# Patient Record
Sex: Female | Born: 1965 | Race: White | Hispanic: No | Marital: Married | Smoking: Never smoker
Health system: Southern US, Community
[De-identification: ages and names within clinical notes are randomized; demographics above are authoritative.]

## PROBLEM LIST (undated history)

## (undated) ENCOUNTER — Emergency Department (HOSPITAL_COMMUNITY): Payer: BC Managed Care – PPO

## (undated) DIAGNOSIS — K219 Gastro-esophageal reflux disease without esophagitis: Secondary | ICD-10-CM

## (undated) DIAGNOSIS — G43909 Migraine, unspecified, not intractable, without status migrainosus: Secondary | ICD-10-CM

## (undated) DIAGNOSIS — G4730 Sleep apnea, unspecified: Secondary | ICD-10-CM

## (undated) DIAGNOSIS — D649 Anemia, unspecified: Secondary | ICD-10-CM

## (undated) HISTORY — DX: Gastro-esophageal reflux disease without esophagitis: K21.9

## (undated) HISTORY — DX: Anemia, unspecified: D64.9

## (undated) HISTORY — PX: BARIATRIC SURGERY: SHX1103

## (undated) HISTORY — PX: OTHER SURGICAL HISTORY: SHX169

## (undated) HISTORY — PX: CHOLECYSTECTOMY: SHX55

---

## 1984-09-21 HISTORY — PX: REDUCTION MAMMAPLASTY: SUR839

## 2005-05-09 ENCOUNTER — Emergency Department: Payer: Self-pay | Admitting: Emergency Medicine

## 2005-11-03 ENCOUNTER — Ambulatory Visit: Payer: Self-pay

## 2005-11-10 ENCOUNTER — Ambulatory Visit: Payer: Self-pay

## 2006-12-01 ENCOUNTER — Ambulatory Visit: Payer: Self-pay

## 2007-04-08 ENCOUNTER — Ambulatory Visit: Payer: Self-pay | Admitting: Unknown Physician Specialty

## 2009-07-21 ENCOUNTER — Emergency Department: Payer: Self-pay | Admitting: Unknown Physician Specialty

## 2010-10-02 ENCOUNTER — Ambulatory Visit: Payer: Self-pay | Admitting: Obstetrics and Gynecology

## 2010-12-05 ENCOUNTER — Ambulatory Visit: Payer: Self-pay | Admitting: Neurology

## 2011-12-21 ENCOUNTER — Ambulatory Visit: Payer: Self-pay

## 2012-12-28 ENCOUNTER — Ambulatory Visit: Payer: Self-pay

## 2013-03-10 ENCOUNTER — Ambulatory Visit: Payer: Self-pay | Admitting: Specialist

## 2013-03-10 LAB — COMPREHENSIVE METABOLIC PANEL
Albumin: 3.5 g/dL (ref 3.4–5.0)
Anion Gap: 3 — ABNORMAL LOW (ref 7–16)
BUN: 10 mg/dL (ref 7–18)
Bilirubin,Total: 0.3 mg/dL (ref 0.2–1.0)
Co2: 30 mmol/L (ref 21–32)
Creatinine: 0.68 mg/dL (ref 0.60–1.30)
EGFR (African American): >60
EGFR (Non-African Amer.): >60
Osmolality: 278 (ref 275–301)
SGOT(AST): 13 U/L — ABNORMAL LOW (ref 15–37)
SGPT (ALT): 20 U/L (ref 12–78)
Sodium: 139 mmol/L (ref 136–145)

## 2013-03-10 LAB — PROTIME-INR
INR: 0.9
Prothrombin Time: 12.8 secs (ref 11.5–14.7)

## 2013-03-10 LAB — CBC WITH DIFFERENTIAL/PLATELET
Basophil #: 0.1 10*3/uL (ref 0.0–0.1)
Basophil %: 0.8 %
Eosinophil #: 0.1 10*3/uL (ref 0.0–0.7)
HGB: 11.9 g/dL — ABNORMAL LOW (ref 12.0–16.0)
MCH: 26.6 pg (ref 26.0–34.0)
MCV: 80 fL (ref 80–100)
Monocyte #: 0.5 x10 3/mm (ref 0.2–0.9)
Neutrophil #: 4.9 10*3/uL (ref 1.4–6.5)
RBC: 4.45 10*6/uL (ref 3.80–5.20)
RDW: 14.5 % (ref 11.5–14.5)

## 2013-03-10 LAB — LIPASE, BLOOD: Lipase: 160 U/L (ref 73–393)

## 2013-03-10 LAB — AMYLASE: Amylase: 25 U/L (ref 25–115)

## 2013-03-10 LAB — FERRITIN: Ferritin (ARMC): 16 ng/mL (ref 8–388)

## 2013-03-10 LAB — IRON AND TIBC
Iron Saturation: 11 %
Iron: 31 ug/dL — ABNORMAL LOW (ref 50–170)

## 2013-03-10 LAB — TSH: Thyroid Stimulating Horm: 0.493 u[IU]/mL

## 2013-03-10 LAB — FOLATE: Folic Acid: 17 ng/mL (ref 3.1–100.0)

## 2013-03-10 LAB — PHOSPHORUS: Phosphorus: 3.2 mg/dL (ref 2.5–4.9)

## 2013-03-10 LAB — MAGNESIUM: Magnesium: 1.7 mg/dL — ABNORMAL LOW

## 2013-04-04 ENCOUNTER — Ambulatory Visit: Payer: Self-pay | Admitting: Specialist

## 2013-04-12 ENCOUNTER — Ambulatory Visit: Payer: Self-pay | Admitting: Specialist

## 2013-04-21 ENCOUNTER — Ambulatory Visit: Payer: Self-pay | Admitting: Specialist

## 2013-05-17 ENCOUNTER — Ambulatory Visit: Payer: Self-pay | Admitting: Specialist

## 2013-09-18 ENCOUNTER — Other Ambulatory Visit: Payer: Self-pay | Admitting: Specialist

## 2013-09-19 LAB — CLOSTRIDIUM DIFFICILE(ARMC)

## 2013-11-01 ENCOUNTER — Ambulatory Visit: Payer: Self-pay | Admitting: Specialist

## 2013-11-09 ENCOUNTER — Ambulatory Visit: Payer: Self-pay | Admitting: Specialist

## 2013-11-09 LAB — HCG, QUANTITATIVE, PREGNANCY

## 2013-11-14 DIAGNOSIS — K8020 Calculus of gallbladder without cholecystitis without obstruction: Secondary | ICD-10-CM | POA: Insufficient documentation

## 2014-01-08 DIAGNOSIS — G43909 Migraine, unspecified, not intractable, without status migrainosus: Secondary | ICD-10-CM | POA: Insufficient documentation

## 2014-01-08 DIAGNOSIS — K219 Gastro-esophageal reflux disease without esophagitis: Secondary | ICD-10-CM

## 2014-01-08 HISTORY — DX: Gastro-esophageal reflux disease without esophagitis: K21.9

## 2014-03-02 IMAGING — US ABDOMEN ULTRASOUND LIMITED
1 series · 14 of 25 positions shown · non-contrast
Comparison: US ABDOMEN LIMITED RUQ/ASCITES dated 03/10/2013

CLINICAL DATA: Gastric bypass surgery

EXAM:
US ABDOMEN LIMITED - RIGHT UPPER QUADRANT

[Series 1: abdomen ultrasound limited · 0.27mm/px · 14 of 44 slices shown]
[im 1/44]
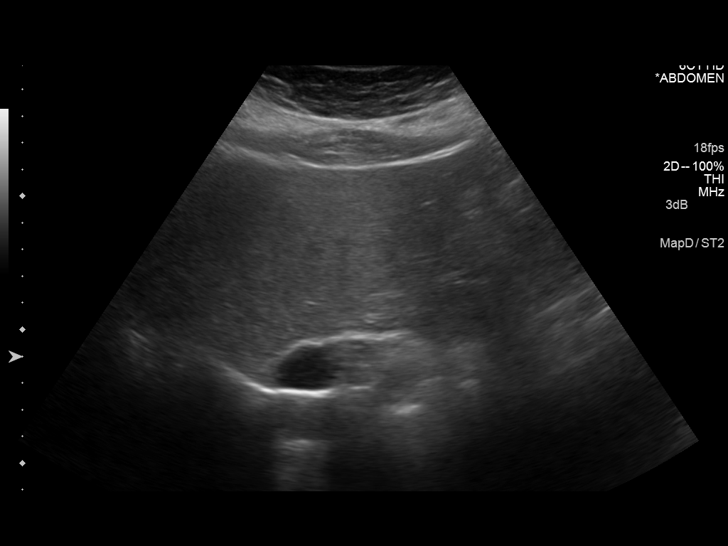
[im 4/44]
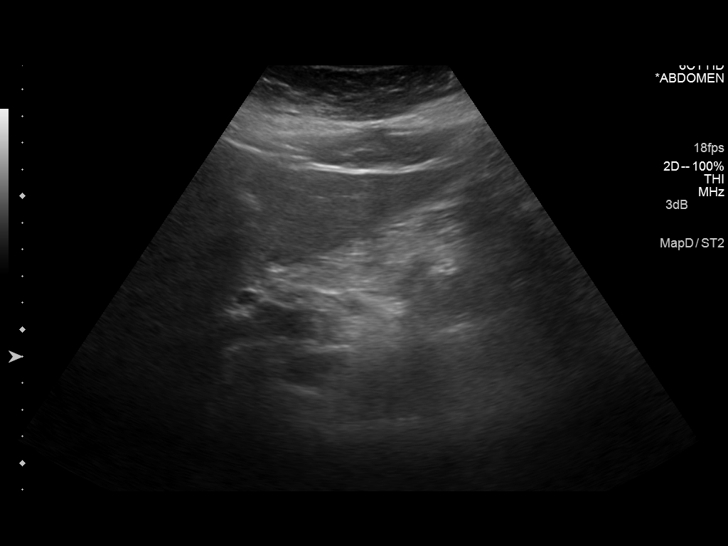
[im 8/44]
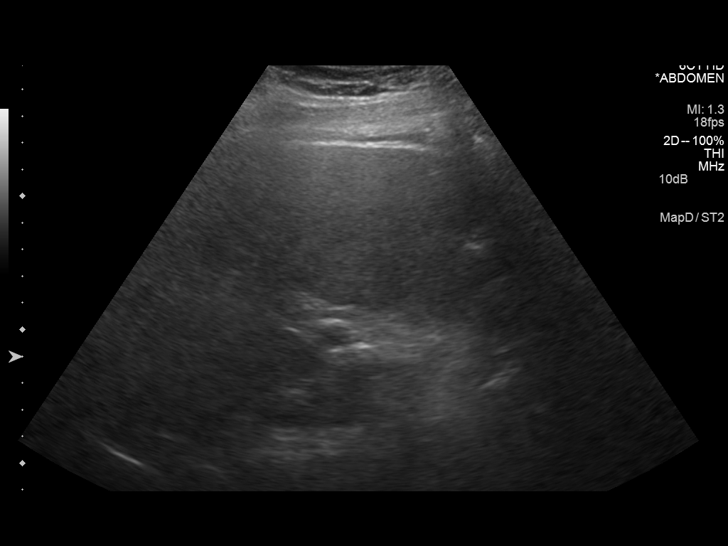
[im 11/44]
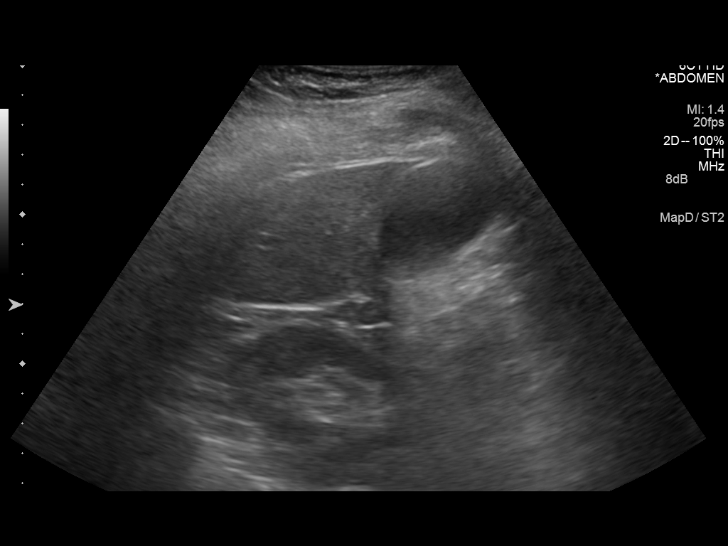
[im 15/44]
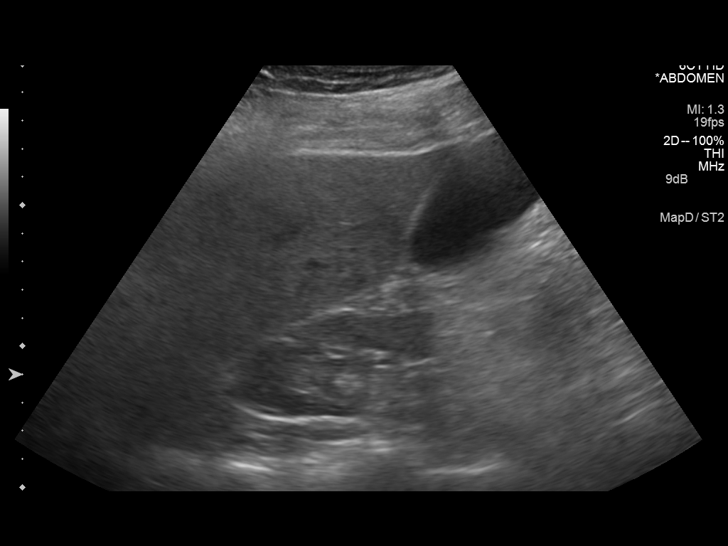
[im 17/44]
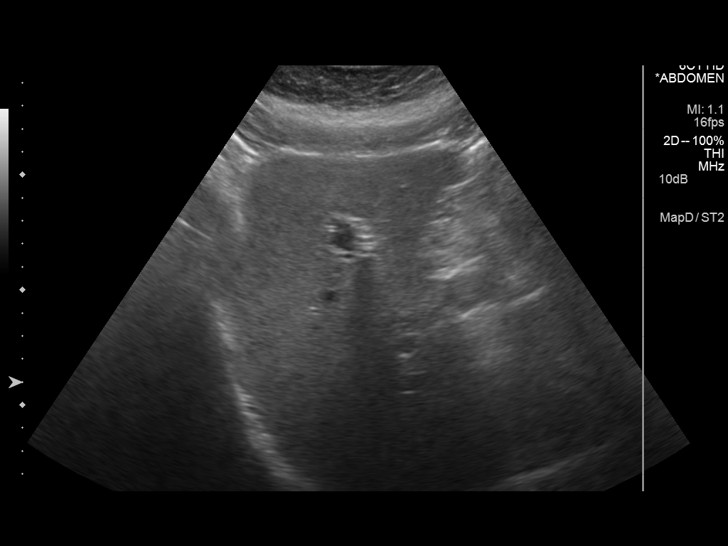
[im 20/44]
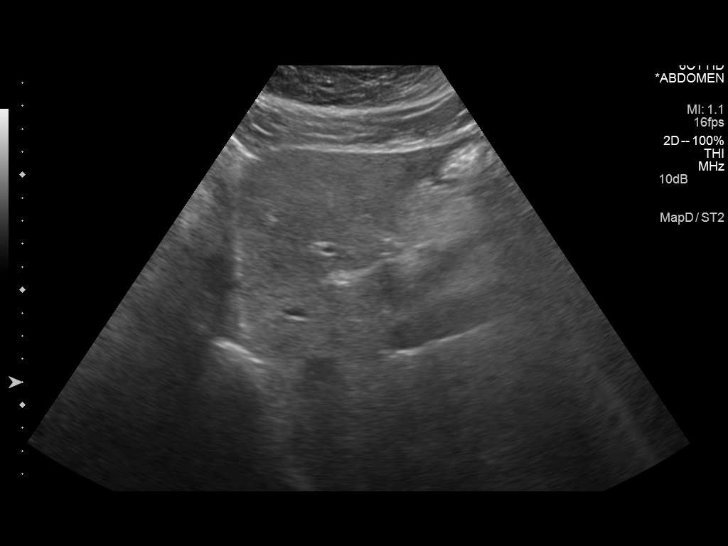
[im 24/44]
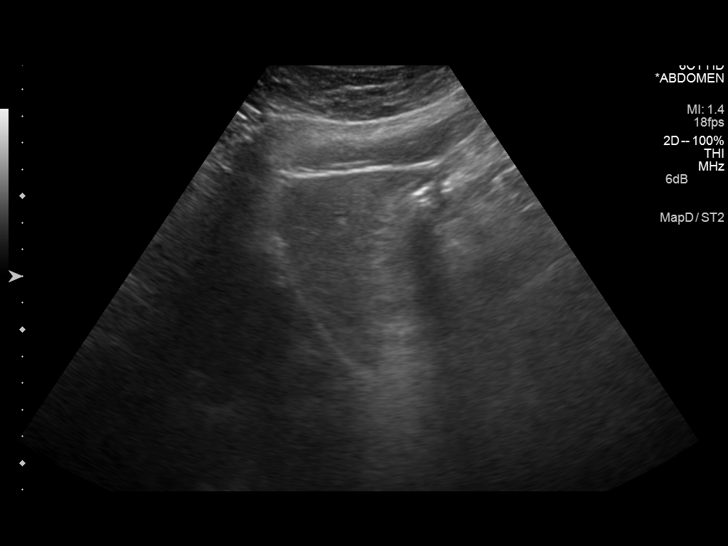
[im 27/44]
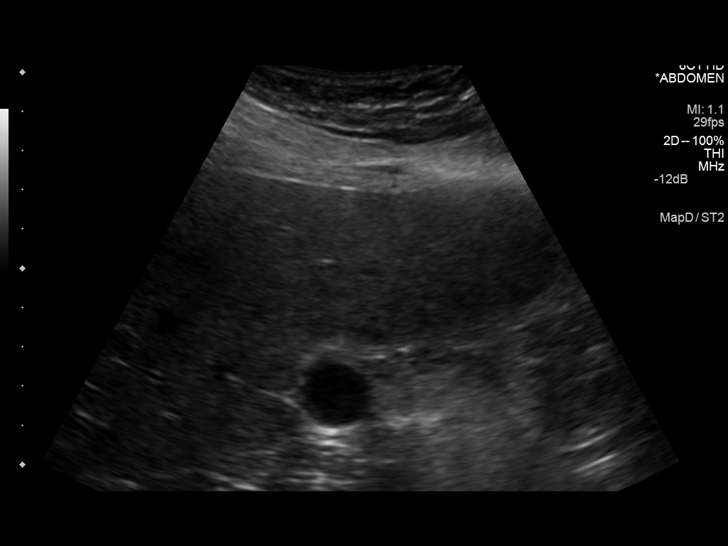
[im 29/44]
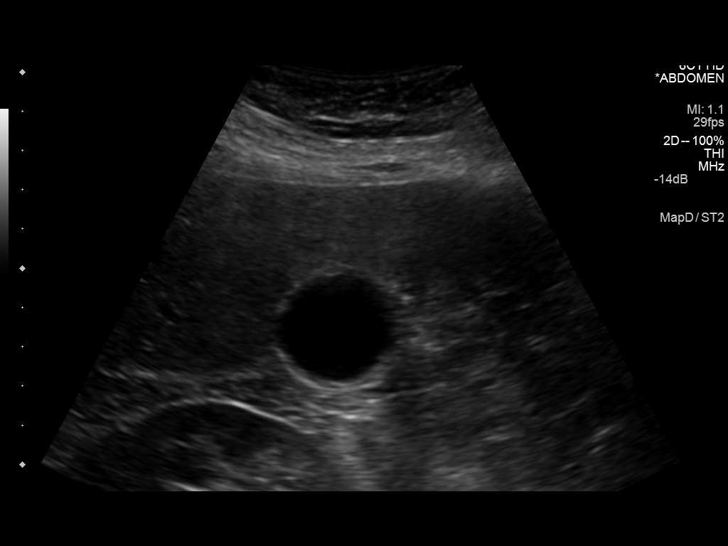
[im 33/44]
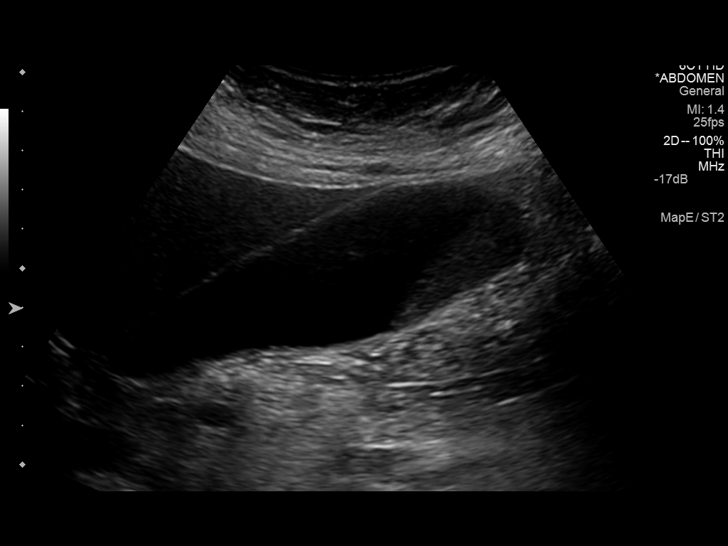
[im 36/44]
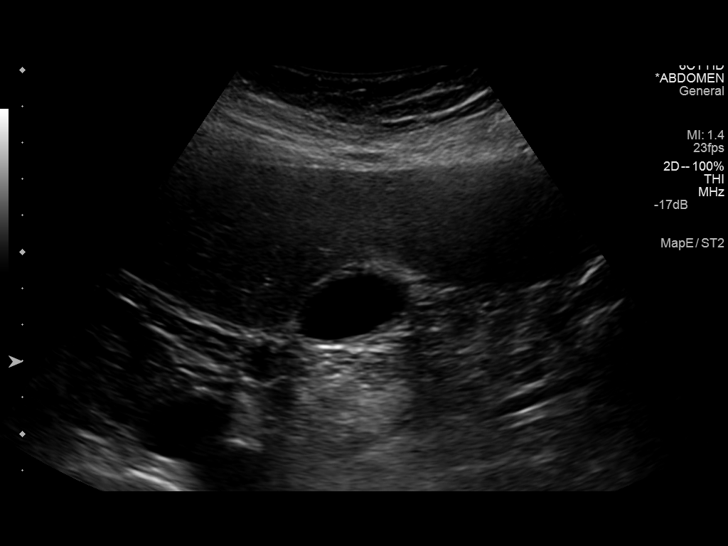
[im 40/44]
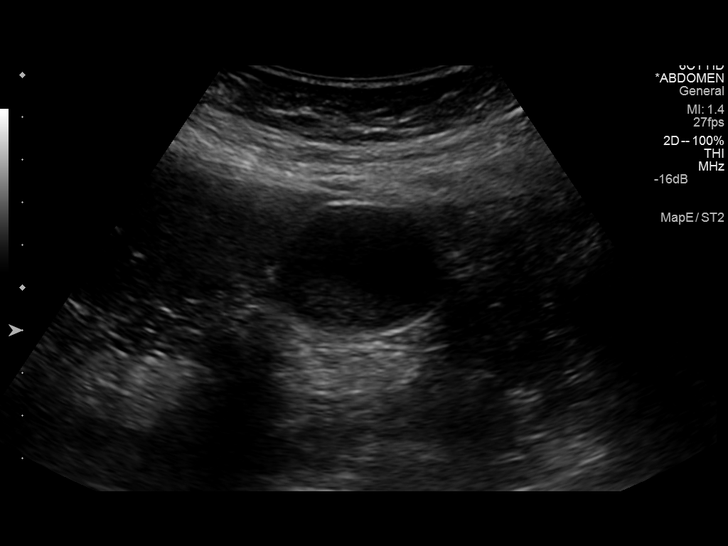
[im 44/44]
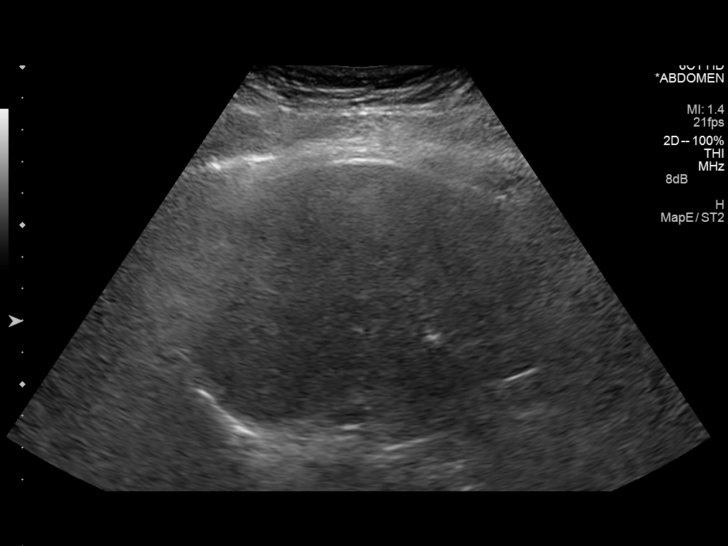

[14 of 25 positions shown; findings below may reference images not displayed]

FINDINGS: Gallbladder:

There is mobile sludge in the lumen of the gallbladder. The
gallbladder wall is normal thickness. No pericholecystic fluid.
Negative sonographic Murphy's sign.

Common bile duct:

Diameter: Common bile duct is dilated to 7 mm.

Liver:

No intrahepatic biliary duct dilatation is noted. Mild increase in
parenchymal echogenicity.
IMPRESSION: 1. Mobile sludge in the gallbladder. No evidence acute
cholecystitis.
2. Mild dilatation of the common bile duct. Recommend correlation
with bilirubin levels and if elevated consider MRCP.
3. Echogenic liver commonly represent hepatic steatosis.

## 2014-03-09 ENCOUNTER — Ambulatory Visit: Payer: Self-pay

## 2014-06-12 ENCOUNTER — Ambulatory Visit: Payer: Self-pay | Admitting: Unknown Physician Specialty

## 2015-08-29 ENCOUNTER — Other Ambulatory Visit: Payer: Self-pay | Admitting: Obstetrics and Gynecology

## 2015-08-29 DIAGNOSIS — Z1231 Encounter for screening mammogram for malignant neoplasm of breast: Secondary | ICD-10-CM

## 2015-09-05 ENCOUNTER — Ambulatory Visit: Payer: Self-pay

## 2015-09-11 ENCOUNTER — Ambulatory Visit: Payer: Self-pay

## 2015-09-19 ENCOUNTER — Ambulatory Visit
Admission: RE | Admit: 2015-09-19 | Discharge: 2015-09-19 | Disposition: A | Discharge status: Home / Self Care | Payer: BC Managed Care – PPO | Source: Ambulatory Visit | Attending: Obstetrics and Gynecology | Admitting: Obstetrics and Gynecology

## 2015-09-19 DIAGNOSIS — Z1231 Encounter for screening mammogram for malignant neoplasm of breast: Secondary | ICD-10-CM | POA: Diagnosis present

## 2016-08-25 ENCOUNTER — Other Ambulatory Visit: Payer: Self-pay | Admitting: Family Medicine

## 2016-08-25 DIAGNOSIS — Z1231 Encounter for screening mammogram for malignant neoplasm of breast: Secondary | ICD-10-CM

## 2016-09-18 ENCOUNTER — Ambulatory Visit
Admission: RE | Admit: 2016-09-18 | Discharge: 2016-09-18 | Disposition: A | Discharge status: Home / Self Care | Payer: BC Managed Care – PPO | Source: Ambulatory Visit | Attending: Family Medicine | Admitting: Family Medicine

## 2016-09-18 DIAGNOSIS — Z1231 Encounter for screening mammogram for malignant neoplasm of breast: Secondary | ICD-10-CM | POA: Diagnosis not present

## 2016-10-16 ENCOUNTER — Encounter: Payer: Self-pay | Admitting: Emergency Medicine

## 2016-10-16 ENCOUNTER — Emergency Department: Payer: BC Managed Care – PPO

## 2016-10-16 ENCOUNTER — Emergency Department
Admission: EM | Admit: 2016-10-16 | Discharge: 2016-10-16 | Disposition: A | Discharge status: Home / Self Care | Payer: BC Managed Care – PPO | Attending: Emergency Medicine | Admitting: Emergency Medicine

## 2016-10-16 DIAGNOSIS — D509 Iron deficiency anemia, unspecified: Secondary | ICD-10-CM | POA: Diagnosis not present

## 2016-10-16 DIAGNOSIS — R42 Dizziness and giddiness: Secondary | ICD-10-CM

## 2016-10-16 LAB — DIFFERENTIAL
BASOS PCT: 1 %
Basophils Absolute: 0.1 10*3/uL (ref 0–0.1)
Eosinophils Absolute: 0.1 10*3/uL (ref 0–0.7)
Eosinophils Relative: 1 %
LYMPHS ABS: 1.8 10*3/uL (ref 1.0–3.6)
Lymphocytes Relative: 33 %
MONOS PCT: 9 %
Monocytes Absolute: 0.5 10*3/uL (ref 0.2–0.9)
Neutro Abs: 3.1 10*3/uL (ref 1.4–6.5)
Neutrophils Relative %: 56 %

## 2016-10-16 LAB — IRON AND TIBC
Iron: 10 ug/dL — ABNORMAL LOW (ref 28–170)
Saturation Ratios: 2 % — ABNORMAL LOW (ref 10.4–31.8)
TIBC: 500 ug/dL — ABNORMAL HIGH (ref 250–450)
UIBC: 490 ug/dL

## 2016-10-16 LAB — TROPONIN I

## 2016-10-16 LAB — CBC
HEMATOCRIT: 27.9 % — AB (ref 35.0–47.0)
HEMOGLOBIN: 8.6 g/dL — AB (ref 12.0–16.0)
MCH: 18.2 pg — AB (ref 26.0–34.0)
MCHC: 30.8 g/dL — AB (ref 32.0–36.0)
MCV: 59.3 fL — AB (ref 80.0–100.0)
Platelets: 338 10*3/uL (ref 150–440)
RBC: 4.71 MIL/uL (ref 3.80–5.20)
RDW: 19.6 % — ABNORMAL HIGH (ref 11.5–14.5)
WBC: 5.6 10*3/uL (ref 3.6–11.0)

## 2016-10-16 LAB — COMPREHENSIVE METABOLIC PANEL
ALBUMIN: 3.9 g/dL (ref 3.5–5.0)
ALK PHOS: 86 U/L (ref 38–126)
ALT: 23 U/L (ref 14–54)
ANION GAP: 6 (ref 5–15)
AST: 25 U/L (ref 15–41)
BILIRUBIN TOTAL: 0.3 mg/dL (ref 0.3–1.2)
BUN: 12 mg/dL (ref 6–20)
CALCIUM: 8.5 mg/dL — AB (ref 8.9–10.3)
CO2: 25 mmol/L (ref 22–32)
Chloride: 105 mmol/L (ref 101–111)
Creatinine, Ser: 0.47 mg/dL (ref 0.44–1.00)
GFR calc Af Amer: >60 mL/min (ref >60)
GLUCOSE: 85 mg/dL (ref 65–99)
POTASSIUM: 4.5 mmol/L (ref 3.5–5.1)
Sodium: 136 mmol/L (ref 135–145)
TOTAL PROTEIN: 7.2 g/dL (ref 6.5–8.1)

## 2016-10-16 LAB — APTT: aPTT: 27 seconds (ref 24–36)

## 2016-10-16 LAB — TRANSFERRIN: Transferrin: 371 mg/dL (ref 192–382)

## 2016-10-16 MED ORDER — FERROUS SULFATE DRIED ER 160 (50 FE) MG PO TBCR: 160.0000 mg | EXTENDED_RELEASE_TABLET | Freq: Every day | ORAL | 6 refills | Status: DC

## 2016-10-16 NOTE — ED Provider Notes (Signed)
[facility] Emergency Department Provider Note        Time seen: ----------------------------------------- 3:54 PM on 10/16/2016 -----------------------------------------    I have reviewed the triage vital signs and the nursing notes.   HISTORY  Chief Complaint Dizziness    HPI Tasha May is a 51 y.o. female presents to the ER for left arm coldness that started last night in the left ring finger. It was progressively worsening today. She is also discomfort in the left shoulder. She's had light headedness that began last night and has progressed to feeling dizzy with nausea this morning.She has not had a history of these symptoms before, the tingling and numbness in her left arm seemed to start last night.   History reviewed. No pertinent past medical history.  There are no active problems to display for this patient.   Past Surgical History:  Procedure Laterality Date  . BARIATRIC SURGERY     08/2013  . REDUCTION MAMMAPLASTY Bilateral 1986    Allergies Patient has no known allergies.  Social History Social History  Substance Use Topics  . Smoking status: Never Smoker  . Smokeless tobacco: Never Used  . Alcohol use Yes     Comment: occasionally    Review of Systems Constitutional: Negative for fever. Cardiovascular: Negative for chest pain. Respiratory: Negative for shortness of breath. Gastrointestinal: Negative for abdominal pain, vomiting and diarrhea. Genitourinary: Negative for dysuria. Musculoskeletal: Negative for back pain. Skin: Negative for rash. Neurological: Negative for headaches, focal weakness Positive for paresthesias in her left arm  10-point ROS otherwise negative.  ____________________________________________   PHYSICAL EXAM:  VITAL SIGNS: ED Triage Vitals  Enc Vitals Group     BP 10/16/16 1141 125/84     Pulse Rate 10/16/16 1141 80     Resp 10/16/16 1141 18     Temp 10/16/16 1141 98.5 F (36.9  C)     Temp src --      SpO2 10/16/16 1141 100 %     Weight 10/16/16 1142 170 lb (77.1 kg)     Height 10/16/16 1142 5\' 3"  (1.6 m)     Head Circumference --      Peak Flow --      Pain Score 10/16/16 1142 3     Pain Loc --      Pain Edu? --      Excl. in GC? --    Constitutional: Alert and oriented. Well appearing and in no distress. Eyes: Conjunctivae are normal. PERRL. Normal extraocular movements. ENT   Head: Normocephalic and atraumatic.   Nose: No congestion/rhinnorhea.   Mouth/Throat: Mucous membranes are moist.   Neck: No stridor. Cardiovascular: Normal rate, regular rhythm. No murmurs, rubs, or gallops. Respiratory: Normal respiratory effort without tachypnea nor retractions. Breath sounds are clear and equal bilaterally. No wheezes/rales/rhonchi. Gastrointestinal: Soft and nontender. Normal bowel sounds, Heme-negative Musculoskeletal: Nontender with normal range of motion in all extremities. No lower extremity tenderness nor edema. Neurologic:  Normal speech and language. No gross focal neurologic deficits are appreciated. Strength, cranial nerves are unremarkable, paresthesias noted to the left arm. Skin:  Skin is warm, dry and intact. No rash noted. Psychiatric: Mood and affect are normal. Speech and behavior are normal.  ____________________________________________  EKG: Interpreted by me. Sinus rhythm rate of 70 bpm, normal PR interval, normal QRS, normal QT, normal axis.  ____________________________________________  ED COURSE:  Pertinent labs & imaging results that were available during my care of the patient were reviewed by me  and considered in my medical decision making (see chart for details). Patient presents to ER with dizziness and multiple complaints. We will assess with labs and imaging.   Procedures ____________________________________________   LABS (pertinent positives/negatives)  Labs Reviewed  CBC - Abnormal; Notable for the  following:       Result Value   Hemoglobin 8.6 (*)    HCT 27.9 (*)    MCV 59.3 (*)    MCH 18.2 (*)    MCHC 30.8 (*)    RDW 19.6 (*)    All other components within normal limits  COMPREHENSIVE METABOLIC PANEL - Abnormal; Notable for the following:    Calcium 8.5 (*)    All other components within normal limits  IRON AND TIBC - Abnormal; Notable for the following:    Iron 10 (*)    TIBC 500 (*)    Saturation Ratios 2 (*)    All other components within normal limits  DIFFERENTIAL  TROPONIN I  APTT  TRANSFERRIN  CBG MONITORING, ED    RADIOLOGY Images were viewed by me CT head  IMPRESSION: Normal study.  ____________________________________________  FINAL ASSESSMENT AND PLAN  Iron deficiency Anemia, dizziness, mild hypocalcemia  Plan: Patient with labs and imaging as dictated above. Patient's symptoms seem to be coming from anemia which is new for her. I will start her on iron, encouraged multivitamin with calcium. Patient was discussed with Dr. Orlie DakinFinnegan from hematology who agrees with plan. She is stable for outpatient follow-up.   Emily FilbertWilliams, Damonie Ellenwood E, MD   Note: This note was generated in part or whole with voice recognition software. Voice recognition is usually quite accurate but there are transcription errors that can and very often do occur. I apologize for any typographical errors that were not detected and corrected.     Emily FilbertJonathan E Anecia Nusbaum, MD 10/16/16 [phone]

## 2016-10-16 NOTE — ED Triage Notes (Signed)
C/O left arm "coldness" that started last night to left ring finger, feeling progressively worsening today.  Also c/o slight discomfort to left shoulder.  Also c/o lightheadedness that began last night and has progressed to feeling dizzy and nausea started this morning.  [facility] was sent here for evaluation.

## 2016-11-23 ENCOUNTER — Inpatient Hospital Stay: Payer: BC Managed Care – PPO | Attending: Oncology | Admitting: Oncology

## 2016-11-23 ENCOUNTER — Encounter: Payer: Self-pay | Admitting: Oncology

## 2016-11-23 ENCOUNTER — Inpatient Hospital Stay: Payer: BC Managed Care – PPO

## 2016-11-23 ENCOUNTER — Telehealth: Payer: Self-pay | Admitting: *Deleted

## 2016-11-23 VITALS — BP 110/75 | HR 79 | Temp 97.8°F | Resp 18 | Ht 64.17 in | Wt 188.6 lb

## 2016-11-23 VITALS — BP 120/75 | HR 76 | Temp 97.4°F | Resp 18

## 2016-11-23 DIAGNOSIS — D508 Other iron deficiency anemias: Secondary | ICD-10-CM

## 2016-11-23 DIAGNOSIS — R42 Dizziness and giddiness: Secondary | ICD-10-CM | POA: Diagnosis not present

## 2016-11-23 DIAGNOSIS — Z800 Family history of malignant neoplasm of digestive organs: Secondary | ICD-10-CM | POA: Diagnosis not present

## 2016-11-23 DIAGNOSIS — R5381 Other malaise: Secondary | ICD-10-CM | POA: Diagnosis not present

## 2016-11-23 DIAGNOSIS — Z79899 Other long term (current) drug therapy: Secondary | ICD-10-CM | POA: Diagnosis not present

## 2016-11-23 DIAGNOSIS — R5383 Other fatigue: Secondary | ICD-10-CM

## 2016-11-23 DIAGNOSIS — E538 Deficiency of other specified B group vitamins: Secondary | ICD-10-CM | POA: Diagnosis not present

## 2016-11-23 DIAGNOSIS — K219 Gastro-esophageal reflux disease without esophagitis: Secondary | ICD-10-CM

## 2016-11-23 DIAGNOSIS — Z9884 Bariatric surgery status: Secondary | ICD-10-CM | POA: Diagnosis not present

## 2016-11-23 DIAGNOSIS — D509 Iron deficiency anemia, unspecified: Secondary | ICD-10-CM | POA: Diagnosis present

## 2016-11-23 LAB — CBC
HEMATOCRIT: 28.2 % — AB (ref 35.0–47.0)
HEMOGLOBIN: 8.3 g/dL — AB (ref 12.0–16.0)
MCH: 17.9 pg — AB (ref 26.0–34.0)
MCHC: 29.5 g/dL — AB (ref 32.0–36.0)
MCV: 60.7 fL — ABNORMAL LOW (ref 80.0–100.0)
Platelets: 317 10*3/uL (ref 150–440)
RBC: 4.65 MIL/uL (ref 3.80–5.20)
RDW: 20 % — ABNORMAL HIGH (ref 11.5–14.5)
WBC: 5.5 10*3/uL (ref 3.6–11.0)

## 2016-11-23 MED ORDER — SODIUM CHLORIDE 0.9 % IV SOLN
Freq: Once | INTRAVENOUS | Status: AC
  Administered 2016-11-23: 11:00:00 via INTRAVENOUS
  Filled 2016-11-23: qty 1000

## 2016-11-23 MED ORDER — SODIUM CHLORIDE 0.9 % IV SOLN
510.0000 mg | Freq: Once | INTRAVENOUS | Status: AC
  Administered 2016-11-23: 510 mg via INTRAVENOUS
  Filled 2016-11-23: qty 17

## 2016-11-23 MED ORDER — CYANOCOBALAMIN 1000 MCG/ML IJ SOLN
1000.0000 ug | Freq: Once | INTRAMUSCULAR | Status: AC
  Administered 2016-11-23: 1000 ug via INTRAMUSCULAR
  Filled 2016-11-23: qty 1

## 2016-11-23 NOTE — Telephone Encounter (Signed)
Called pt and left her a message that Dr. Smith Robertao wanted pt to see gi because of her IDA and s/p gastric bypass but also the fact that her mother was dx with colon cancer in her 3740's.  She had seen Dr. Mechele CollinElliott in the past.  Made appt 4/10 at 10 am. I left all this info on her voicemail and asked her to call me with if she is agreeable with this plan or not. I left her my number for [place] and for [place].

## 2016-11-23 NOTE — Progress Notes (Signed)
Hematology/Oncology Consult note [facility] [phone]) [phone] [phone]  Patient Care Team: Marina Goodellale E Feldpausch, MD as PCP - General (Family Medicine)   Name of the patient: Tasha CeoDenise May  [id number]  28-Nov-1965    Reason for referral- iron deficiency anemia   Referring physician- Dr. Maryjane HurterFeldpausch  Date of visit: 11/23/16   History of presenting illness- patient is a 51 year old female with prior history of gastric bypass surgery. She was recently seen in the emergency room for symptoms of dizziness. At that time she was found to have H&H of 8.6/27.9 with an MCV of 59.3. Iron studies showed a low serum iron of 10 and elevated TIBC of 500. Patient was started on oral iron and discharged. Repeat CBC on 11/13/2016 showed white count of 6.5, H&H of 12/28.6 with an MCV of 63 and a platelet count of 374. Iron studies showed a ferritin of 2 with an elevated TIBC of 487.3 and a low serum iron of 10. B12 levels were also found to be low at 197. Folate was normal at 8.7. Vitamin D levels were low at 28.3. B1 levels were normal at 74.9  ECOG PS- 0  Pain scale- 0   Review of systems- Review of Systems  Constitutional: Positive for malaise/fatigue. Negative for chills, fever and weight loss.  HENT: Negative for congestion, ear discharge and nosebleeds.   Eyes: Negative for blurred vision.  Respiratory: Negative for cough, hemoptysis, sputum production, shortness of breath and wheezing.   Cardiovascular: Negative for chest pain, palpitations, orthopnea and claudication.  Gastrointestinal: Negative for abdominal pain, blood in stool, constipation, diarrhea, heartburn, melena, nausea and vomiting.  Genitourinary: Negative for dysuria, flank pain, frequency, hematuria and urgency.  Musculoskeletal: Negative for back pain, joint pain and myalgias.  Skin: Negative for rash.  Neurological: Positive for weakness. Negative for dizziness, tingling, focal weakness,  seizures and headaches.  Endo/Heme/Allergies: Does not bruise/bleed easily.  Psychiatric/Behavioral: Negative for depression and suicidal ideas. The patient does not have insomnia.     Allergies no known allergies  Patient Active Problem List   Diagnosis Date Noted  . Iron deficiency anemia 11/23/2016  . H/O gastric bypass 11/23/2016  . Esophageal reflux 01/08/2014  . Migraine 01/08/2014  . Gallstones 11/14/2013     Past Medical History:  Diagnosis Date  . Anemia   . Esophageal reflux 01/08/2014     Past Surgical History:  Procedure Laterality Date  . BARIATRIC SURGERY     08/2013  . CHOLECYSTECTOMY    . REDUCTION MAMMAPLASTY Bilateral 1986    Social History   Social History  . Marital status: Married    Spouse name: N/A  . Number of children: N/A  . Years of education: N/A   Occupational History  . Not on file.   Social History Main Topics  . Smoking status: Never Smoker  . Smokeless tobacco: Never Used  . Alcohol use Yes     Comment: occasionally  . Drug use: No  . Sexual activity: No   Other Topics Concern  . Not on file   Social History Narrative  . No narrative on file     Family History  Problem Relation Age of Onset  . Breast cancer Neg Hx      Current Outpatient Prescriptions:  .  butalbital-acetaminophen-caffeine (FIORICET, ESGIC) 50-325-40 MG tablet, Take 1 tablet by mouth every 4 (four) hours as needed for headache., Disp: , Rfl:  .  CREATINE PO, Take 1 tablet by mouth daily as needed.,  Disp: , Rfl:  .  ferrous sulfate (EQL SLOW RELEASE IRON) 160 (50 Fe) MG TBCR SR tablet, Take 1 tablet (160 mg total) by mouth daily., Disp: 30 each, Rfl: 6 .  Multiple Vitamin (MULTIVITAMIN WITH MINERALS) TABS tablet, Take 1 tablet by mouth daily., Disp: , Rfl:  No current facility-administered medications for this visit.   Facility-Administered Medications Ordered in Other Visits:  .  0.9 %  sodium chloride infusion, , Intravenous, Once, Creig Hines,  MD .  cyanocobalamin ((VITAMIN B-12)) injection 1,000 mcg, 1,000 mcg, Intramuscular, Once, Creig Hines, MD .  ferumoxytol [facility]) 510 mg in sodium chloride 0.9 % 100 mL IVPB, 510 mg, Intravenous, Once, Creig Hines, MD   Physical exam:  Vitals:   11/23/16 1022  BP: 110/75  Pulse: 79  Resp: 18  Temp: 97.8 F (36.6 C)  TempSrc: Tympanic  Weight: 188 lb 9.7 oz (85.6 kg)  Height: 5' 4.17" (1.63 m)   Physical Exam  Constitutional: She is oriented to person, place, and time and well-developed, well-nourished, and in no distress.  HENT:  Head: Normocephalic and atraumatic.  Eyes: EOM are normal. Pupils are equal, round, and reactive to light.  Neck: Normal range of motion.  Cardiovascular: Normal rate, regular rhythm and normal heart sounds.   Pulmonary/Chest: Effort normal and breath sounds normal.  Abdominal: Soft. Bowel sounds are normal.  Neurological: She is alert and oriented to person, place, and time.  Skin: Skin is warm and dry.       CMP Latest Ref Rng & Units 10/16/2016  Glucose 65 - 99 mg/dL 85  BUN 6 - 20 mg/dL 12  Creatinine 4.09 - 8.11 mg/dL 9.14  Sodium 782 - 956 mmol/L 136  Potassium 3.5 - 5.1 mmol/L 4.5  Chloride 101 - 111 mmol/L 105  CO2 22 - 32 mmol/L 25  Calcium 8.9 - 10.3 mg/dL 2.1(H)  Total Protein 6.5 - 8.1 g/dL 7.2  Total Bilirubin 0.3 - 1.2 mg/dL 0.3  Alkaline Phos 38 - 126 U/L 86  AST 15 - 41 U/L 25  ALT 14 - 54 U/L 23   CBC Latest Ref Rng & Units 10/16/2016  WBC 3.6 - 11.0 K/uL 5.6  Hemoglobin 12.0 - 16.0 g/dL 0.8(M)  Hematocrit 57.8 - 47.0 % 27.9(L)  Platelets 150 - 440 K/uL 338     Assessment and plan- Patient is a 51 y.o. female who has been referred to [place] for iron deficiency anemia likely secondary to gastric bypass  1. Patient's iron deficiency anemia is likely secondary to gastric bypass surgery. Patient has been on oral iron for more than 4 weeks now with no significant improvement. At this time we will schedule her to receive  IV iron ferriheme 510 mg 4 doses on a weekly basis. Patient was also found to have low B12 levels and we will make arrangements for her to receive weekly 4 doses followed by monthly doses of B12. Today I will check a repeat CBC along with zinc and copper levels as well.  2. Patients last colonoscopy was in 2016 which was unremarkable. However given family h/o colon cancer in her mother in her 67's and ongoing iron deficiency anemia, I will refer her back to [facility] clinic GI   Thank you for this kind referral and the opportunity to participate in the care of this patient   Visit Diagnosis 1. B12 deficiency   2. Other iron deficiency anemia   3. H/O gastric bypass  Dr. Owens Shark, MD, MPH [facility] at [facility] Pager- [phone] 11/23/2016 11:22 AM

## 2016-11-23 NOTE — Patient Instructions (Signed)
cyanCyanocobalamin, Vitamin B12 injection What is this medicine? CYANOCOBALAMIN (sye an oh koe BAL a min) is a man made form of vitamin B12. Vitamin B12 is used in the growth of healthy blood cells, nerve cells, and proteins in the body. It also helps with the metabolism of fats and carbohydrates. This medicine is used to treat people who can not absorb vitamin B12. This medicine may be used for other purposes; ask your health care provider or pharmacist if you have questions. COMMON BRAND NAME(S): B-12 Compliance Kit, B-12 Injection Kit, Cyomin, LA-12, Nutri-Twelve, Physicians EZ Use B-12, Primabalt What should I tell my health care provider before I take this medicine? They need to know if you have any of these conditions: -kidney disease -Leber's disease -megaloblastic anemia -an unusual or allergic reaction to cyanocobalamin, cobalt, other medicines, foods, dyes, or preservatives -pregnant or trying to get pregnant -breast-feeding How should I use this medicine? This medicine is injected into a muscle or deeply under the skin. It is usually given by a health care professional in a clinic or doctor's office. However, your doctor may teach you how to inject yourself. Follow all instructions. Talk to your pediatrician regarding the use of this medicine in children. Special care may be needed. Overdosage: If you think you have taken too much of this medicine contact a poison control center or emergency room at once. NOTE: This medicine is only for you. Do not share this medicine with others. What if I miss a dose? If you are given your dose at a clinic or doctor's office, call to reschedule your appointment. If you give your own injections and you miss a dose, take it as soon as you can. If it is almost time for your next dose, take only that dose. Do not take double or extra doses. What may interact with this medicine? -colchicine -heavy alcohol intake This list may not describe all possible  interactions. Give your health care provider a list of all the medicines, herbs, non-prescription drugs, or dietary supplements you use. Also tell them if you smoke, drink alcohol, or use illegal drugs. Some items may interact with your medicine. What should I watch for while using this medicine? Visit your doctor or health care professional regularly. You may need blood work done while you are taking this medicine. You may need to follow a special diet. Talk to your doctor. Limit your alcohol intake and avoid smoking to get the best benefit. What side effects may I notice from receiving this medicine? Side effects that you should report to your doctor or health care professional as soon as possible: -allergic reactions like skin rash, itching or hives, swelling of the face, lips, or tongue -blue tint to skin -chest tightness, pain -difficulty breathing, wheezing -dizziness -red, swollen painful area on the leg Side effects that usually do not require medical attention (report to your doctor or health care professional if they continue or are bothersome): -diarrhea -headache This list may not describe all possible side effects. Call your doctor for medical advice about side effects. You may report side effects to FDA at 1-800-FDA-1088. Where should I keep my medicine? Keep out of the reach of children. Store at room temperature between 15 and 30 degrees C (59 and 85 degrees F). Protect from light. Throw away any unused medicine after the expiration date. NOTE: This sheet is a summary. It may not cover all possible information. If you have questions about this medicine, talk to your doctor, pharmacist, or  health care provider.  2018 Elsevier/Gold Standard (2007-12-19 22:10:20) Ferumoxytol injection What is this medicine? FERUMOXYTOL is an iron complex. Iron is used to make healthy red blood cells, which carry oxygen and nutrients throughout the body. This medicine is used to treat iron  deficiency anemia in people with chronic kidney disease. This medicine may be used for other purposes; ask your health care provider or pharmacist if you have questions. COMMON BRAND NAME(S): Feraheme What should I tell my health care provider before I take this medicine? They need to know if you have any of these conditions: -anemia not caused by low iron levels -high levels of iron in the blood -magnetic resonance imaging (MRI) test scheduled -an unusual or allergic reaction to iron, other medicines, foods, dyes, or preservatives -pregnant or trying to get pregnant -breast-feeding How should I use this medicine? This medicine is for injection into a vein. It is given by a health care professional in a hospital or clinic setting. Talk to your pediatrician regarding the use of this medicine in children. Special care may be needed. Overdosage: If you think you have taken too much of this medicine contact a poison control center or emergency room at once. NOTE: This medicine is only for you. Do not share this medicine with others. What if I miss a dose? It is important not to miss your dose. Call your doctor or health care professional if you are unable to keep an appointment. What may interact with this medicine? This medicine may interact with the following medications: -other iron products This list may not describe all possible interactions. Give your health care provider a list of all the medicines, herbs, non-prescription drugs, or dietary supplements you use. Also tell them if you smoke, drink alcohol, or use illegal drugs. Some items may interact with your medicine. What should I watch for while using this medicine? Visit your doctor or healthcare professional regularly. Tell your doctor or healthcare professional if your symptoms do not start to get better or if they get worse. You may need blood work done while you are taking this medicine. You may need to follow a special diet. Talk  to your doctor. Foods that contain iron include: whole grains/cereals, dried fruits, beans, or peas, leafy green vegetables, and organ meats (liver, kidney). What side effects may I notice from receiving this medicine? Side effects that you should report to your doctor or health care professional as soon as possible: -allergic reactions like skin rash, itching or hives, swelling of the face, lips, or tongue -breathing problems -changes in blood pressure -feeling faint or lightheaded, falls -fever or chills -flushing, sweating, or hot feelings -swelling of the ankles or feet Side effects that usually do not require medical attention (report to your doctor or health care professional if they continue or are bothersome): -diarrhea -headache -nausea, vomiting -stomach pain This list may not describe all possible side effects. Call your doctor for medical advice about side effects. You may report side effects to FDA at 1-800-FDA-1088. Where should I keep my medicine? This drug is given in a hospital or clinic and will not be stored at home. NOTE: This sheet is a summary. It may not cover all possible information. If you have questions about this medicine, talk to your doctor, pharmacist, or health care provider.  2018 Elsevier/Gold Standard (2015-10-10 12:41:49)

## 2016-11-25 LAB — ZINC: Zinc: 72 ug/dL (ref 56–134)

## 2016-11-25 LAB — CELIAC DISEASE PANEL

## 2016-11-25 LAB — COPPER, SERUM: Copper: 122 ug/dL (ref 72–166)

## 2016-11-26 ENCOUNTER — Encounter: Payer: Self-pay | Admitting: Family Medicine

## 2016-11-26 LAB — CELIAC DISEASE PANEL
Endomysial Ab, IgA: NEGATIVE
IgA: 278 mg/dL (ref 87–352)
Tissue Transglutaminase Ab, IgA: <2 U/mL (ref 0–3)

## 2016-11-30 ENCOUNTER — Inpatient Hospital Stay: Payer: BC Managed Care – PPO

## 2016-11-30 ENCOUNTER — Ambulatory Visit: Payer: BC Managed Care – PPO

## 2016-11-30 VITALS — BP 102/65 | HR 67 | Temp 97.1°F | Resp 18

## 2016-11-30 DIAGNOSIS — D508 Other iron deficiency anemias: Secondary | ICD-10-CM

## 2016-11-30 DIAGNOSIS — D509 Iron deficiency anemia, unspecified: Secondary | ICD-10-CM | POA: Diagnosis not present

## 2016-11-30 MED ORDER — CYANOCOBALAMIN 1000 MCG/ML IJ SOLN
1000.0000 ug | Freq: Once | INTRAMUSCULAR | Status: AC
  Administered 2016-11-30: 1000 ug via INTRAMUSCULAR
  Filled 2016-11-30: qty 1

## 2016-11-30 MED ORDER — SODIUM CHLORIDE 0.9 % IV SOLN
Freq: Once | INTRAVENOUS | Status: AC
  Administered 2016-11-30: 09:00:00 via INTRAVENOUS
  Filled 2016-11-30: qty 1000

## 2016-11-30 MED ORDER — SODIUM CHLORIDE 0.9 % IV SOLN
510.0000 mg | Freq: Once | INTRAVENOUS | Status: AC
  Administered 2016-11-30: 510 mg via INTRAVENOUS
  Filled 2016-11-30: qty 17

## 2016-11-30 NOTE — Patient Instructions (Signed)

## 2016-12-07 ENCOUNTER — Ambulatory Visit: Payer: BC Managed Care – PPO

## 2016-12-08 ENCOUNTER — Inpatient Hospital Stay: Payer: BC Managed Care – PPO

## 2016-12-08 VITALS — BP 112/72 | HR 83 | Temp 96.4°F | Resp 18

## 2016-12-08 DIAGNOSIS — D509 Iron deficiency anemia, unspecified: Secondary | ICD-10-CM | POA: Diagnosis not present

## 2016-12-08 DIAGNOSIS — D508 Other iron deficiency anemias: Secondary | ICD-10-CM

## 2016-12-08 MED ORDER — SODIUM CHLORIDE 0.9 % IV SOLN
Freq: Once | INTRAVENOUS | Status: AC
  Administered 2016-12-08: 10:00:00 via INTRAVENOUS
  Filled 2016-12-08: qty 1000

## 2016-12-08 MED ORDER — CYANOCOBALAMIN 1000 MCG/ML IJ SOLN
1000.0000 ug | Freq: Once | INTRAMUSCULAR | Status: AC
  Administered 2016-12-08: 1000 ug via INTRAMUSCULAR
  Filled 2016-12-08: qty 1

## 2016-12-08 MED ORDER — SODIUM CHLORIDE 0.9 % IV SOLN
510.0000 mg | Freq: Once | INTRAVENOUS | Status: AC
  Administered 2016-12-08: 510 mg via INTRAVENOUS
  Filled 2016-12-08: qty 17

## 2016-12-08 NOTE — Patient Instructions (Signed)
Cyanocobalamin, Vitamin B12 injection What is this medicine? CYANOCOBALAMIN (sye an oh koe BAL a min) is a man made form of vitamin B12. Vitamin B12 is used in the growth of healthy blood cells, nerve cells, and proteins in the body. It also helps with the metabolism of fats and carbohydrates. This medicine is used to treat people who can not absorb vitamin B12. This medicine may be used for other purposes; ask your health care provider or pharmacist if you have questions. COMMON BRAND NAME(S): B-12 Compliance Kit, B-12 Injection Kit, Cyomin, LA-12, Nutri-Twelve, Physicians EZ Use B-12, Primabalt What should I tell my health care provider before I take this medicine? They need to know if you have any of these conditions: -kidney disease -Leber's disease -megaloblastic anemia -an unusual or allergic reaction to cyanocobalamin, cobalt, other medicines, foods, dyes, or preservatives -pregnant or trying to get pregnant -breast-feeding How should I use this medicine? This medicine is injected into a muscle or deeply under the skin. It is usually given by a health care professional in a clinic or doctor's office. However, your doctor may teach you how to inject yourself. Follow all instructions. Talk to your pediatrician regarding the use of this medicine in children. Special care may be needed. Overdosage: If you think you have taken too much of this medicine contact a poison control center or emergency room at once. NOTE: This medicine is only for you. Do not share this medicine with others. What if I miss a dose? If you are given your dose at a clinic or doctor's office, call to reschedule your appointment. If you give your own injections and you miss a dose, take it as soon as you can. If it is almost time for your next dose, take only that dose. Do not take double or extra doses. What may interact with this medicine? -colchicine -heavy alcohol intake This list may not describe all possible  interactions. Give your health care provider a list of all the medicines, herbs, non-prescription drugs, or dietary supplements you use. Also tell them if you smoke, drink alcohol, or use illegal drugs. Some items may interact with your medicine. What should I watch for while using this medicine? Visit your doctor or health care professional regularly. You may need blood work done while you are taking this medicine. You may need to follow a special diet. Talk to your doctor. Limit your alcohol intake and avoid smoking to get the best benefit. What side effects may I notice from receiving this medicine? Side effects that you should report to your doctor or health care professional as soon as possible: -allergic reactions like skin rash, itching or hives, swelling of the face, lips, or tongue -blue tint to skin -chest tightness, pain -difficulty breathing, wheezing -dizziness -red, swollen painful area on the leg Side effects that usually do not require medical attention (report to your doctor or health care professional if they continue or are bothersome): -diarrhea -headache This list may not describe all possible side effects. Call your doctor for medical advice about side effects. You may report side effects to FDA at 1-800-FDA-1088. Where should I keep my medicine? Keep out of the reach of children. Store at room temperature between 15 and 30 degrees C (59 and 85 degrees F). Protect from light. Throw away any unused medicine after the expiration date. NOTE: This sheet is a summary. It may not cover all possible information. If you have questions about this medicine, talk to your doctor, pharmacist, or   health care provider.  2018 Elsevier/Gold Standard (2007-12-19 22:10:20) Iron Dextran injection What is this medicine? IRON DEXTRAN (AHY ern DEX tran) is an iron complex. Iron is used to make healthy red blood cells, which carry oxygen and nutrients through the body. This medicine is used to  treat people who cannot take iron by mouth and have low levels of iron in the blood. This medicine may be used for other purposes; ask your health care provider or pharmacist if you have questions. COMMON BRAND NAME(S): Dexferrum, INFeD What should I tell my health care provider before I take this medicine? They need to know if you have any of these conditions: -anemia not caused by low iron levels -heart disease -high levels of iron in the blood -kidney disease -liver disease -an unusual or allergic reaction to iron, other medicines, foods, dyes, or preservatives -pregnant or trying to get pregnant -breast-feeding How should I use this medicine? This medicine is for injection into a vein or a muscle. It is given by a health care professional in a hospital or clinic setting. Talk to your pediatrician regarding the use of this medicine in children. While this drug may be prescribed for children as young as 4 months old for selected conditions, precautions do apply. Overdosage: If you think you have taken too much of this medicine contact a poison control center or emergency room at once. NOTE: This medicine is only for you. Do not share this medicine with others. What if I miss a dose? It is important not to miss your dose. Call your doctor or health care professional if you are unable to keep an appointment. What may interact with this medicine? Do not take this medicine with any of the following medications: -deferoxamine -dimercaprol -other iron products This medicine may also interact with the following medications: -chloramphenicol -deferasirox This list may not describe all possible interactions. Give your health care provider a list of all the medicines, herbs, non-prescription drugs, or dietary supplements you use. Also tell them if you smoke, drink alcohol, or use illegal drugs. Some items may interact with your medicine. What should I watch for while using this medicine? Visit  your doctor or health care professional regularly. Tell your doctor if your symptoms do not start to get better or if they get worse. You may need blood work done while you are taking this medicine. You may need to follow a special diet. Talk to your doctor. Foods that contain iron include: whole grains/cereals, dried fruits, beans, or peas, leafy green vegetables, and organ meats (liver, kidney). Long-term use of this medicine may increase your risk of some cancers. Talk to your doctor about how to limit your risk. What side effects may I notice from receiving this medicine? Side effects that you should report to your doctor or health care professional as soon as possible: -allergic reactions like skin rash, itching or hives, swelling of the face, lips, or tongue -blue lips, nails, or skin -breathing problems -changes in blood pressure -chest pain -confusion -fast, irregular heartbeat -feeling faint or lightheaded, falls -fever or chills -flushing, sweating, or hot feelings -joint or muscle aches or pains -pain, tingling, numbness in the hands or feet -seizures -unusually weak or tired Side effects that usually do not require medical attention (report to your doctor or health care professional if they continue or are bothersome): -change in taste (metallic taste) -diarrhea -headache -irritation at site where injected -nausea, vomiting -stomach upset This list may not describe all possible side   effects. Call your doctor for medical advice about side effects. You may report side effects to FDA at 1-800-FDA-1088. Where should I keep my medicine? This drug is given in a hospital or clinic and will not be stored at home. NOTE: This sheet is a summary. It may not cover all possible information. If you have questions about this medicine, talk to your doctor, pharmacist, or health care provider.  2018 Elsevier/Gold Standard (2008-01-24 16:59:50)  

## 2016-12-14 ENCOUNTER — Ambulatory Visit: Payer: BC Managed Care – PPO

## 2016-12-15 ENCOUNTER — Inpatient Hospital Stay: Payer: BC Managed Care – PPO

## 2016-12-15 VITALS — BP 115/78 | HR 66 | Temp 97.8°F | Resp 18

## 2016-12-15 DIAGNOSIS — D508 Other iron deficiency anemias: Secondary | ICD-10-CM

## 2016-12-15 DIAGNOSIS — D509 Iron deficiency anemia, unspecified: Secondary | ICD-10-CM | POA: Diagnosis not present

## 2016-12-15 MED ORDER — SODIUM CHLORIDE 0.9 % IV SOLN
Freq: Once | INTRAVENOUS | Status: AC
  Administered 2016-12-15: 10:00:00 via INTRAVENOUS
  Filled 2016-12-15: qty 1000

## 2016-12-15 MED ORDER — CYANOCOBALAMIN 1000 MCG/ML IJ SOLN
1000.0000 ug | Freq: Once | INTRAMUSCULAR | Status: AC
  Administered 2016-12-15: 1000 ug via INTRAMUSCULAR
  Filled 2016-12-15: qty 1

## 2016-12-15 MED ORDER — SODIUM CHLORIDE 0.9 % IV SOLN
510.0000 mg | Freq: Once | INTRAVENOUS | Status: AC
  Administered 2016-12-15: 510 mg via INTRAVENOUS
  Filled 2016-12-15: qty 17

## 2016-12-15 NOTE — Patient Instructions (Signed)

## 2016-12-18 ENCOUNTER — Ambulatory Visit: Payer: BC Managed Care – PPO

## 2017-01-21 ENCOUNTER — Encounter: Payer: Self-pay | Admitting: Obstetrics and Gynecology

## 2017-01-25 ENCOUNTER — Telehealth: Payer: Self-pay | Admitting: *Deleted

## 2017-01-25 ENCOUNTER — Inpatient Hospital Stay: Payer: BC Managed Care – PPO

## 2017-01-25 ENCOUNTER — Inpatient Hospital Stay: Payer: BC Managed Care – PPO | Attending: Oncology | Admitting: Oncology

## 2017-01-25 ENCOUNTER — Encounter: Payer: Self-pay | Admitting: Oncology

## 2017-01-25 VITALS — BP 106/69 | HR 74 | Temp 98.3°F | Resp 18 | Ht 63.0 in | Wt 192.9 lb

## 2017-01-25 DIAGNOSIS — Z79899 Other long term (current) drug therapy: Secondary | ICD-10-CM | POA: Insufficient documentation

## 2017-01-25 DIAGNOSIS — Z85038 Personal history of other malignant neoplasm of large intestine: Secondary | ICD-10-CM | POA: Insufficient documentation

## 2017-01-25 DIAGNOSIS — D508 Other iron deficiency anemias: Secondary | ICD-10-CM

## 2017-01-25 DIAGNOSIS — R51 Headache: Secondary | ICD-10-CM | POA: Diagnosis not present

## 2017-01-25 DIAGNOSIS — D509 Iron deficiency anemia, unspecified: Secondary | ICD-10-CM | POA: Insufficient documentation

## 2017-01-25 DIAGNOSIS — E538 Deficiency of other specified B group vitamins: Secondary | ICD-10-CM

## 2017-01-25 DIAGNOSIS — Z9884 Bariatric surgery status: Secondary | ICD-10-CM | POA: Insufficient documentation

## 2017-01-25 DIAGNOSIS — K219 Gastro-esophageal reflux disease without esophagitis: Secondary | ICD-10-CM | POA: Insufficient documentation

## 2017-01-25 DIAGNOSIS — R5383 Other fatigue: Secondary | ICD-10-CM | POA: Insufficient documentation

## 2017-01-25 LAB — IRON AND TIBC
Iron: 99 ug/dL (ref 28–170)
SATURATION RATIOS: 44 % — AB (ref 10.4–31.8)
TIBC: 224 ug/dL — ABNORMAL LOW (ref 250–450)
UIBC: 125 ug/dL

## 2017-01-25 LAB — CBC
HEMATOCRIT: 42 % (ref 35.0–47.0)
HEMOGLOBIN: 13.6 g/dL (ref 12.0–16.0)
MCH: 26.2 pg (ref 26.0–34.0)
MCHC: 32.4 g/dL (ref 32.0–36.0)
MCV: 80.8 fL (ref 80.0–100.0)
Platelets: 197 10*3/uL (ref 150–440)
RBC: 5.2 MIL/uL (ref 3.80–5.20)
RDW: 27.8 % — ABNORMAL HIGH (ref 11.5–14.5)
WBC: 5.1 10*3/uL (ref 3.6–11.0)

## 2017-01-25 LAB — FERRITIN: FERRITIN: 223 ng/mL (ref 11–307)

## 2017-01-25 LAB — VITAMIN B12: VITAMIN B 12: 1064 pg/mL — AB (ref 180–914)

## 2017-01-25 MED ORDER — CYANOCOBALAMIN 1000 MCG/ML IJ SOLN
1000.0000 ug | Freq: Once | INTRAMUSCULAR | Status: AC
  Administered 2017-01-25: 1000 ug via INTRAMUSCULAR

## 2017-01-25 NOTE — Addendum Note (Signed)
Addended by: Corene CorneaVENABLE, Xitlaly Ault Y on: 01/25/2017 10:27 AM   Modules accepted: Orders

## 2017-01-25 NOTE — Progress Notes (Signed)
Patient here for follow up. She has been experiencing headaches for the past week.

## 2017-01-25 NOTE — Progress Notes (Signed)
Hematology/Oncology Consult note [facility]  [phone] [phone]  Patient Care Team: Marina Goodell, MD as PCP - General (Family Medicine)   Name of the patient: Tasha May  [id number]  23-May-1966   Date of visit: 01/25/17  Diagnosis- iron deficiency anemia  Chief complaint/ Reason for visit- routine f/u  Heme/Onc history: patient is a 51 year old female with prior history of gastric bypass surgery. She was recently seen in the emergency room for symptoms of dizziness. At that time she was found to have H&H of 8.6/27.9 with an MCV of 59.3. Iron studies showed a low serum iron of 10 and elevated TIBC of 500. Patient was started on oral iron and discharged. Repeat CBC on 11/13/2016 showed white count of 6.5, H&H of 12/28.6 with an MCV of 63 and a platelet count of 374. Iron studies showed a ferritin of 2 with an elevated TIBC of 487.3 and a low serum iron of 10. B12 levels were also found to be low at 197. Folate was normal at 8.7. Vitamin D levels were low at 28.3. B1 levels were normal at 74.9  Patient has received 4 doses of feraheme so far. Last dose on 12/15/16  Interval history- reports occasional headaches. Fatigue is improved    Review of systems- Review of Systems  Constitutional: Negative for chills, fever, malaise/fatigue and weight loss.  HENT: Negative for congestion, ear discharge and nosebleeds.   Eyes: Negative for blurred vision.  Respiratory: Negative for cough, hemoptysis, sputum production, shortness of breath and wheezing.   Cardiovascular: Negative for chest pain, palpitations, orthopnea and claudication.  Gastrointestinal: Negative for abdominal pain, blood in stool, constipation, diarrhea, heartburn, melena, nausea and vomiting.  Genitourinary: Negative for dysuria, flank pain, frequency, hematuria and urgency.  Musculoskeletal: Negative for back pain, joint pain and myalgias.  Skin: Negative for rash.    Neurological: Positive for headaches. Negative for dizziness, tingling, focal weakness, seizures and weakness.  Endo/Heme/Allergies: Does not bruise/bleed easily.  Psychiatric/Behavioral: Negative for depression and suicidal ideas. The patient does not have insomnia.      Current treatment- IV iron  No Known Allergies   Past Medical History:  Diagnosis Date  . Anemia   . Esophageal reflux 01/08/2014     Past Surgical History:  Procedure Laterality Date  . BARIATRIC SURGERY     08/2013  . CHOLECYSTECTOMY    . REDUCTION MAMMAPLASTY Bilateral 1986    Social History   Social History  . Marital status: Married    Spouse name: N/A  . Number of children: N/A  . Years of education: N/A   Occupational History  . Not on file.   Social History Main Topics  . Smoking status: Never Smoker  . Smokeless tobacco: Never Used  . Alcohol use Yes     Comment: occasionally  . Drug use: No  . Sexual activity: No   Other Topics Concern  . Not on file   Social History Narrative  . No narrative on file    Family History  Problem Relation Age of Onset  . Breast cancer Neg Hx      Current Outpatient Prescriptions:  .  butalbital-acetaminophen-caffeine (FIORICET, ESGIC) 50-325-40 MG tablet, Take 1 tablet by mouth every 4 (four) hours as needed for headache., Disp: , Rfl:  .  CREATINE PO, Take 1 tablet by mouth daily as needed., Disp: , Rfl:  .  ferrous sulfate (EQL SLOW RELEASE IRON) 160 (50 Fe) MG TBCR SR tablet,  Take 1 tablet (160 mg total) by mouth daily., Disp: 30 each, Rfl: 6 .  Multiple Vitamin (MULTIVITAMIN WITH MINERALS) TABS tablet, Take 1 tablet by mouth daily., Disp: , Rfl:   Physical exam:  Vitals:   01/25/17 0921  BP: 106/69  Pulse: 74  Resp: 18  Temp: 98.3 F (36.8 C)  TempSrc: Tympanic  Weight: 192 lb 14.4 oz (87.5 kg)  Height: 5\' 3"  (1.6 m)   Physical Exam  Constitutional: She is oriented to person, place, and time and well-developed, well-nourished,  and in no distress.  HENT:  Head: Normocephalic and atraumatic.  Eyes: EOM are normal. Pupils are equal, round, and reactive to light.  Neck: Normal range of motion.  Cardiovascular: Normal rate, regular rhythm and normal heart sounds.   Pulmonary/Chest: Effort normal and breath sounds normal.  Abdominal: Soft. Bowel sounds are normal.  Neurological: She is alert and oriented to person, place, and time.  Skin: Skin is warm and dry.     CMP Latest Ref Rng & Units 10/16/2016  Glucose 65 - 99 mg/dL 85  BUN 6 - 20 mg/dL 12  Creatinine 1.910.44 - 4.781.00 mg/dL 2.950.47  Sodium 621135 - 308145 mmol/L 136  Potassium 3.5 - 5.1 mmol/L 4.5  Chloride 101 - 111 mmol/L 105  CO2 22 - 32 mmol/L 25  Calcium 8.9 - 10.3 mg/dL 6.5(H8.5(L)  Total Protein 6.5 - 8.1 g/dL 7.2  Total Bilirubin 0.3 - 1.2 mg/dL 0.3  Alkaline Phos 38 - 126 U/L 86  AST 15 - 41 U/L 25  ALT 14 - 54 U/L 23   CBC Latest Ref Rng & Units 01/25/2017  WBC 3.6 - 11.0 K/uL 5.1  Hemoglobin 12.0 - 16.0 g/dL 84.613.6  Hematocrit 96.235.0 - 47.0 % 42.0  Platelets 150 - 440 K/uL 197      Assessment and plan- Patient is a 51 y.o. 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 Iron studies from today. Will likely not need IV iron at this time. Repeat levels and MD vsiit in 3 months  b12 deficiency- continue monthly b12 injections  Will again touch base with [facility] if she needs egd or colonoscopy given family h/o colon ca and iron deficiency anemis     Visit Diagnosis 1. Other iron deficiency anemia   2. B12 deficiency      Dr. Owens SharkArchana Lizvet Chunn, MD, MPH [facility] Pager- [phone] 01/25/2017 9:58 AM

## 2017-01-25 NOTE — Telephone Encounter (Signed)
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  I told her that I would let md know that it will be done this summer.

## 2017-02-22 ENCOUNTER — Inpatient Hospital Stay: Payer: BC Managed Care – PPO | Attending: Oncology

## 2017-02-22 VITALS — BP 104/70 | HR 93 | Temp 97.0°F | Resp 18

## 2017-02-22 DIAGNOSIS — E538 Deficiency of other specified B group vitamins: Secondary | ICD-10-CM | POA: Diagnosis not present

## 2017-02-22 DIAGNOSIS — Z9884 Bariatric surgery status: Secondary | ICD-10-CM | POA: Diagnosis not present

## 2017-02-22 DIAGNOSIS — D508 Other iron deficiency anemias: Secondary | ICD-10-CM

## 2017-02-22 MED ORDER — CYANOCOBALAMIN 1000 MCG/ML IJ SOLN
1000.0000 ug | Freq: Once | INTRAMUSCULAR | Status: AC
Start: 2017-02-22 — End: 2017-02-22
  Administered 2017-02-22: 1000 ug via INTRAMUSCULAR
  Filled 2017-02-22: qty 1

## 2017-03-22 ENCOUNTER — Ambulatory Visit: Payer: BC Managed Care – PPO

## 2017-03-25 ENCOUNTER — Inpatient Hospital Stay: Payer: BC Managed Care – PPO | Attending: Oncology

## 2017-04-19 ENCOUNTER — Ambulatory Visit: Payer: BC Managed Care – PPO

## 2017-04-19 ENCOUNTER — Inpatient Hospital Stay: Payer: BC Managed Care – PPO

## 2017-04-19 ENCOUNTER — Inpatient Hospital Stay: Payer: BC Managed Care – PPO | Attending: Oncology | Admitting: Oncology

## 2017-04-19 ENCOUNTER — Ambulatory Visit: Payer: BC Managed Care – PPO | Admitting: Oncology

## 2017-04-19 ENCOUNTER — Other Ambulatory Visit: Payer: BC Managed Care – PPO

## 2017-04-19 VITALS — BP 109/74 | HR 85 | Temp 97.1°F | Resp 18 | Wt 184.6 lb

## 2017-04-19 DIAGNOSIS — E538 Deficiency of other specified B group vitamins: Secondary | ICD-10-CM | POA: Insufficient documentation

## 2017-04-19 DIAGNOSIS — D508 Other iron deficiency anemias: Secondary | ICD-10-CM

## 2017-04-19 DIAGNOSIS — Z79899 Other long term (current) drug therapy: Secondary | ICD-10-CM | POA: Diagnosis not present

## 2017-04-19 DIAGNOSIS — Z9884 Bariatric surgery status: Secondary | ICD-10-CM | POA: Diagnosis not present

## 2017-04-19 DIAGNOSIS — K219 Gastro-esophageal reflux disease without esophagitis: Secondary | ICD-10-CM | POA: Diagnosis not present

## 2017-04-19 LAB — VITAMIN B12: VITAMIN B 12: 726 pg/mL (ref 180–914)

## 2017-04-19 LAB — CBC
HEMATOCRIT: 41.1 % (ref 35.0–47.0)
HEMOGLOBIN: 13.8 g/dL (ref 12.0–16.0)
MCH: 30.5 pg (ref 26.0–34.0)
MCHC: 33.6 g/dL (ref 32.0–36.0)
MCV: 90.6 fL (ref 80.0–100.0)
Platelets: 221 10*3/uL (ref 150–440)
RBC: 4.54 MIL/uL (ref 3.80–5.20)
RDW: 13.1 % (ref 11.5–14.5)
WBC: 5.8 10*3/uL (ref 3.6–11.0)

## 2017-04-19 LAB — IRON AND TIBC
IRON: 58 ug/dL (ref 28–170)
SATURATION RATIOS: 21 % (ref 10.4–31.8)
TIBC: 279 ug/dL (ref 250–450)
UIBC: 221 ug/dL

## 2017-04-19 LAB — FERRITIN: FERRITIN: 99 ng/mL (ref 11–307)

## 2017-04-19 MED ORDER — CYANOCOBALAMIN 1000 MCG/ML IJ SOLN
1000.0000 ug | Freq: Once | INTRAMUSCULAR | Status: AC
  Administered 2017-04-19: 1000 ug via INTRAMUSCULAR

## 2017-04-19 NOTE — Progress Notes (Signed)
Hematology/Oncology Consult note [facility]  Telephone:([phone]) [phone] [phone]  Patient Care Team: Marina GoodellFeldpausch, Dale E, MD as PCP - General (Family Medicine)   Name of the patient: Tasha CeoDenise May  [id number]  April 15, 1966   Date of visit: 04/19/17  Diagnosis- iron deficiency anemia  Chief complaint/ Reason for visit- routine f/u  Heme/Onc history: patient is a 51 year old female with prior history of gastric bypass surgery. She was recently seen in the emergency room for symptoms of dizziness. At that time she was found to have H&H of 8.6/27.9 with an MCV of 59.3. Iron studies showed a low serum iron of 10 and elevated TIBC of 500. Patient was started on oral iron and discharged. Repeat CBC on 11/13/2016 showed white count of 6.5, H&H of 12/28.6 with an MCV of 63 and a platelet count of 374. Iron studies showed a ferritin of 2 with an elevated TIBC of 487.3 and a low serum iron of 10. B12 levels were also found to be low at 197. Folate was normal at 8.7. Vitamin D levels were low at 28.3. B1 levels were normal at 74.9. Last colonoscopy from July 2016 was normal and repeat colonoscopy in 5 years was recommended. Last EGD was from July 2014 which showed medium sized hiatal hernia. Localized mild inflammation characterized by congestion in the stomach.  Patient has received 4 doses of feraheme so far. Last dose on 12/15/16  Interval history- feels well. Denies any complaints  ECOG PS- 0 Pain scale- 0   Review of systems- Review of Systems  Constitutional: Negative for chills, fever, malaise/fatigue and weight loss.  HENT: Negative for congestion, ear discharge and nosebleeds.   Eyes: Negative for blurred vision.  Respiratory: Negative for cough, hemoptysis, sputum production, shortness of breath and wheezing.   Cardiovascular: Negative for chest pain, palpitations, orthopnea and claudication.  Gastrointestinal: Negative for abdominal pain, blood in  stool, constipation, diarrhea, heartburn, melena, nausea and vomiting.  Genitourinary: Negative for dysuria, flank pain, frequency, hematuria and urgency.  Musculoskeletal: Negative for back pain, joint pain and myalgias.  Skin: Negative for rash.  Neurological: Negative for dizziness, tingling, focal weakness, seizures, weakness and headaches.  Endo/Heme/Allergies: Does not bruise/bleed easily.  Psychiatric/Behavioral: Negative for depression and suicidal ideas. The patient does not have insomnia.        No Known Allergies   Past Medical History:  Diagnosis Date  . Anemia   . Esophageal reflux 01/08/2014     Past Surgical History:  Procedure Laterality Date  . BARIATRIC SURGERY     08/2013  . CHOLECYSTECTOMY    . REDUCTION MAMMAPLASTY Bilateral 1986    Social History   Social History  . Marital status: Married    Spouse name: N/A  . Number of children: N/A  . Years of education: N/A   Occupational History  . Not on file.   Social History Main Topics  . Smoking status: Never Smoker  . Smokeless tobacco: Never Used  . Alcohol use Yes     Comment: occasionally  . Drug use: No  . Sexual activity: No   Other Topics Concern  . Not on file   Social History Narrative  . No narrative on file    Family History  Problem Relation Age of Onset  . Breast cancer Neg Hx      Current Outpatient Prescriptions:  .  butalbital-acetaminophen-caffeine (FIORICET, ESGIC) 50-325-40 MG tablet, Take 1 tablet by mouth every 4 (four) hours as needed for headache.,  Disp: , Rfl:  .  Calcium Carb-Ergocalciferol 500-200 MG-UNIT TABS, Take by mouth., Disp: , Rfl:  .  CREATINE PO, Take 1 tablet by mouth daily as needed., Disp: , Rfl:  .  ferrous sulfate (EQL SLOW RELEASE IRON) 160 (50 Fe) MG TBCR SR tablet, Take 1 tablet (160 mg total) by mouth daily., Disp: 30 each, Rfl: 6 .  lipase/protease/amylase (CREON) 36000 UNITS CPEP capsule, Take by mouth., Disp: , Rfl:  .  Multiple Vitamin  (MULTIVITAMIN) capsule, Take by mouth., Disp: , Rfl:  .  Naltrexone-Bupropion HCl ER 8-90 MG TB12, Take by mouth., Disp: , Rfl:  .  vitamin A 1610910000 UNIT capsule, Take by mouth., Disp: , Rfl:  .  vitamin E 1000 UNIT capsule, Take by mouth., Disp: , Rfl:  .  Vitamin K, Phytonadione, 100 MCG TABS, Take by mouth., Disp: , Rfl:  No current facility-administered medications for this visit.   Facility-Administered Medications Ordered in Other Visits:  .  cyanocobalamin ((VITAMIN B-12)) injection 1,000 mcg, 1,000 mcg, Intramuscular, Once, Creig Hinesao, Archana C, MD  Physical exam:  Vitals:   04/19/17 1541  BP: 109/74  Pulse: 85  Resp: 18  Temp: (!) 97.1 F (36.2 C)  TempSrc: Tympanic  Weight: 184 lb 10.2 oz (83.7 kg)   Physical Exam  Constitutional: She is oriented to person, place, and time and well-developed, well-nourished, and in no distress.  HENT:  Head: Normocephalic and atraumatic.  Eyes: Pupils are equal, round, and reactive to light. EOM are normal.  Neck: Normal range of motion.  Cardiovascular: Normal rate, regular rhythm and normal heart sounds.   Pulmonary/Chest: Effort normal and breath sounds normal.  Abdominal: Soft. Bowel sounds are normal.  Neurological: She is alert and oriented to person, place, and time.  Skin: Skin is warm and dry.     CMP Latest Ref Rng & Units 10/16/2016  Glucose 65 - 99 mg/dL 85  BUN 6 - 20 mg/dL 12  Creatinine 6.040.44 - 5.401.00 mg/dL 9.810.47  Sodium 191135 - 478145 mmol/L 136  Potassium 3.5 - 5.1 mmol/L 4.5  Chloride 101 - 111 mmol/L 105  CO2 22 - 32 mmol/L 25  Calcium 8.9 - 10.3 mg/dL 2.9(F8.5(L)  Total Protein 6.5 - 8.1 g/dL 7.2  Total Bilirubin 0.3 - 1.2 mg/dL 0.3  Alkaline Phos 38 - 126 U/L 86  AST 15 - 41 U/L 25  ALT 14 - 54 U/L 23   CBC Latest Ref Rng & Units 01/25/2017  WBC 3.6 - 11.0 K/uL 5.1  Hemoglobin 12.0 - 16.0 g/dL 62.113.6  Hematocrit 30.835.0 - 47.0 % 42.0  Platelets 150 - 440 K/uL 197      Assessment and plan- Patient is a 51 y.o. female with  iron and B12 deficiency anemia likely secondary to gastric bypass  1. Hb 13.8 today. Iron studies pending. No need for IV iron at this time. She will get her b12 shot today. She will continue PO b12 at this point. We will recheck b12 levels in 3 months and decide if she needs more b12 at that point.    Repeat cbc, ferritin and iron studies and b12 in 3 and 6 months and I will see her in 6 months   Visit Diagnosis 1. H/O gastric bypass   2. Other iron deficiency anemia   3. B12 deficiency      Dr. Owens SharkArchana Rao, MD, MPH [facility] Pager- [phone] 04/19/2017 3:33 PM

## 2017-04-19 NOTE — Patient Instructions (Signed)
Cyanocobalamin, Vitamin B12 injection What is this medicine? CYANOCOBALAMIN (sye an oh koe BAL a min) is a man made form of vitamin B12. Vitamin B12 is used in the growth of healthy blood cells, nerve cells, and proteins in the body. It also helps with the metabolism of fats and carbohydrates. This medicine is used to treat people who can not absorb vitamin B12. This medicine may be used for other purposes; ask your health care provider or pharmacist if you have questions. COMMON BRAND NAME(S): B-12 Compliance Kit, B-12 Injection Kit, Cyomin, LA-12, Nutri-Twelve, Physicians EZ Use B-12, Primabalt What should I tell my health care provider before I take this medicine? They need to know if you have any of these conditions: -kidney disease -Leber's disease -megaloblastic anemia -an unusual or allergic reaction to cyanocobalamin, cobalt, other medicines, foods, dyes, or preservatives -pregnant or trying to get pregnant -breast-feeding How should I use this medicine? This medicine is injected into a muscle or deeply under the skin. It is usually given by a health care professional in a clinic or doctor's office. However, your doctor may teach you how to inject yourself. Follow all instructions. Talk to your pediatrician regarding the use of this medicine in children. Special care may be needed. Overdosage: If you think you have taken too much of this medicine contact a poison control center or emergency room at once. NOTE: This medicine is only for you. Do not share this medicine with others. What if I miss a dose? If you are given your dose at a clinic or doctor's office, call to reschedule your appointment. If you give your own injections and you miss a dose, take it as soon as you can. If it is almost time for your next dose, take only that dose. Do not take double or extra doses. What may interact with this medicine? -colchicine -heavy alcohol intake This list may not describe all possible  interactions. Give your health care provider a list of all the medicines, herbs, non-prescription drugs, or dietary supplements you use. Also tell them if you smoke, drink alcohol, or use illegal drugs. Some items may interact with your medicine. What should I watch for while using this medicine? Visit your doctor or health care professional regularly. You may need blood work done while you are taking this medicine. You may need to follow a special diet. Talk to your doctor. Limit your alcohol intake and avoid smoking to get the best benefit. What side effects may I notice from receiving this medicine? Side effects that you should report to your doctor or health care professional as soon as possible: -allergic reactions like skin rash, itching or hives, swelling of the face, lips, or tongue -blue tint to skin -chest tightness, pain -difficulty breathing, wheezing -dizziness -red, swollen painful area on the leg Side effects that usually do not require medical attention (report to your doctor or health care professional if they continue or are bothersome): -diarrhea -headache This list may not describe all possible side effects. Call your doctor for medical advice about side effects. You may report side effects to FDA at 1-800-FDA-1088. Where should I keep my medicine? Keep out of the reach of children. Store at room temperature between 15 and 30 degrees C (59 and 85 degrees F). Protect from light. Throw away any unused medicine after the expiration date. NOTE: This sheet is a summary. It may not cover all possible information. If you have questions about this medicine, talk to your doctor, pharmacist, or   health care provider.  2018 Elsevier/Gold Standard (2007-12-19 22:10:20)  

## 2017-04-19 NOTE — Progress Notes (Signed)
Here for follow up

## 2017-05-12 ENCOUNTER — Other Ambulatory Visit: Payer: Self-pay | Admitting: Obstetrics & Gynecology

## 2017-05-12 DIAGNOSIS — Z1231 Encounter for screening mammogram for malignant neoplasm of breast: Secondary | ICD-10-CM

## 2017-05-17 ENCOUNTER — Inpatient Hospital Stay: Payer: BC Managed Care – PPO | Attending: Oncology

## 2017-06-14 ENCOUNTER — Inpatient Hospital Stay: Payer: BC Managed Care – PPO | Attending: Oncology

## 2017-06-16 ENCOUNTER — Ambulatory Visit: Payer: BC Managed Care – PPO

## 2017-07-12 ENCOUNTER — Inpatient Hospital Stay: Payer: BC Managed Care – PPO

## 2017-07-12 ENCOUNTER — Inpatient Hospital Stay: Payer: BC Managed Care – PPO | Attending: Oncology

## 2017-08-09 ENCOUNTER — Inpatient Hospital Stay: Payer: BC Managed Care – PPO | Attending: Oncology

## 2017-09-06 ENCOUNTER — Inpatient Hospital Stay: Payer: BC Managed Care – PPO | Attending: Oncology

## 2017-10-04 ENCOUNTER — Inpatient Hospital Stay: Payer: BC Managed Care – PPO | Attending: Oncology

## 2017-11-01 ENCOUNTER — Inpatient Hospital Stay: Payer: BC Managed Care – PPO

## 2017-11-01 ENCOUNTER — Ambulatory Visit: Payer: BC Managed Care – PPO | Admitting: Oncology

## 2017-11-01 ENCOUNTER — Inpatient Hospital Stay: Payer: BC Managed Care – PPO | Attending: Oncology | Admitting: Oncology

## 2017-11-01 ENCOUNTER — Other Ambulatory Visit: Payer: BC Managed Care – PPO

## 2017-11-29 ENCOUNTER — Inpatient Hospital Stay: Payer: BC Managed Care – PPO | Attending: Oncology

## 2017-12-27 ENCOUNTER — Inpatient Hospital Stay: Payer: BC Managed Care – PPO | Attending: Oncology

## 2018-01-24 ENCOUNTER — Ambulatory Visit: Payer: BC Managed Care – PPO

## 2019-08-31 ENCOUNTER — Other Ambulatory Visit
Admission: RE | Admit: 2019-08-31 | Discharge: 2019-08-31 | Disposition: A | Discharge status: Home / Self Care | Payer: BC Managed Care – PPO | Source: Ambulatory Visit | Attending: Ophthalmology | Admitting: Ophthalmology

## 2019-08-31 DIAGNOSIS — Z01812 Encounter for preprocedural laboratory examination: Secondary | ICD-10-CM | POA: Diagnosis present

## 2019-08-31 DIAGNOSIS — Z20828 Contact with and (suspected) exposure to other viral communicable diseases: Secondary | ICD-10-CM | POA: Insufficient documentation

## 2019-08-31 LAB — SARS CORONAVIRUS 2 (TAT 6-24 HRS): SARS Coronavirus 2: NEGATIVE

## 2019-08-31 NOTE — Anesthesia Preprocedure Evaluation (Addendum)
Anesthesia Evaluation  Patient identified by MRN, date of birth, ID band Patient awake    Reviewed: Allergy & Precautions, NPO status , Patient's Chart, lab work & pertinent test results  History of Anesthesia Complications Negative for: history of anesthetic complications  Airway Mallampati: II  TM Distance: >3 FB Neck ROM: Full    Dental   Pulmonary sleep apnea ,    breath sounds clear to auscultation       Cardiovascular (-) angina(-) DOE  Rhythm:Regular Rate:Normal     Neuro/Psych  Headaches,    GI/Hepatic GERD  Controlled, H/o gastric bypass   Endo/Other    Renal/GU      Musculoskeletal   Abdominal (+) + obese (BMI 34),   Peds  Hematology  (+) anemia ,   Anesthesia Other Findings   Reproductive/Obstetrics                            Anesthesia Physical Anesthesia Plan  ASA: II  Anesthesia Plan: MAC   Post-op Pain Management:    Induction: Intravenous  PONV Risk Score and Plan: 2 and TIVA, Midazolam and Treatment may vary due to age or medical condition  Airway Management Planned: Nasal Cannula  Additional Equipment:   Intra-op Plan:   Post-operative Plan:   Informed Consent: I have reviewed the patients History and Physical, chart, labs and discussed the procedure including the risks, benefits and alternatives for the proposed anesthesia with the patient or authorized representative who has indicated his/her understanding and acceptance.       Plan Discussed with: CRNA and Anesthesiologist  Anesthesia Plan Comments:         Anesthesia Quick Evaluation

## 2019-08-31 NOTE — Discharge Instructions (Signed)

## 2019-09-04 ENCOUNTER — Ambulatory Visit: Payer: BC Managed Care – PPO | Admitting: Anesthesiology

## 2019-09-04 ENCOUNTER — Encounter
Admission: RE | Disposition: A | Discharge status: Home / Self Care | Payer: Self-pay | Source: Home / Self Care | Attending: Ophthalmology

## 2019-09-04 ENCOUNTER — Other Ambulatory Visit: Payer: Self-pay

## 2019-09-04 ENCOUNTER — Encounter: Payer: Self-pay | Admitting: Ophthalmology

## 2019-09-04 ENCOUNTER — Ambulatory Visit
Admission: RE | Admit: 2019-09-04 | Discharge: 2019-09-04 | Disposition: A | Discharge status: Home / Self Care | Payer: BC Managed Care – PPO | Attending: Ophthalmology | Admitting: Ophthalmology

## 2019-09-04 DIAGNOSIS — E669 Obesity, unspecified: Secondary | ICD-10-CM | POA: Insufficient documentation

## 2019-09-04 DIAGNOSIS — H2511 Age-related nuclear cataract, right eye: Secondary | ICD-10-CM | POA: Diagnosis not present

## 2019-09-04 DIAGNOSIS — Z6834 Body mass index (BMI) 34.0-34.9, adult: Secondary | ICD-10-CM | POA: Diagnosis not present

## 2019-09-04 DIAGNOSIS — H401111 Primary open-angle glaucoma, right eye, mild stage: Secondary | ICD-10-CM | POA: Diagnosis not present

## 2019-09-04 DIAGNOSIS — R519 Headache, unspecified: Secondary | ICD-10-CM | POA: Diagnosis not present

## 2019-09-04 DIAGNOSIS — K219 Gastro-esophageal reflux disease without esophagitis: Secondary | ICD-10-CM | POA: Diagnosis not present

## 2019-09-04 DIAGNOSIS — D649 Anemia, unspecified: Secondary | ICD-10-CM | POA: Diagnosis not present

## 2019-09-04 DIAGNOSIS — Z9884 Bariatric surgery status: Secondary | ICD-10-CM | POA: Diagnosis not present

## 2019-09-04 HISTORY — DX: Migraine, unspecified, not intractable, without status migrainosus: G43.909

## 2019-09-04 HISTORY — DX: Sleep apnea, unspecified: G47.30

## 2019-09-04 HISTORY — PX: CATARACT EXTRACTION W/PHACO: SHX586

## 2019-09-04 LAB — POCT PREGNANCY, URINE: Preg Test, Ur: NEGATIVE

## 2019-09-04 SURGERY — PHACOEMULSIFICATION, CATARACT, WITH IOL INSERTION
Anesthesia: Monitor Anesthesia Care | Laterality: Right

## 2019-09-04 MED ORDER — MOXIFLOXACIN HCL 0.5 % OP SOLN
OPHTHALMIC | Status: DC | PRN
  Administered 2019-09-04: 0.2 mL via OPHTHALMIC

## 2019-09-04 MED ORDER — LACTATED RINGERS IV SOLN: 100.0000 mL/h | INTRAVENOUS | Status: DC

## 2019-09-04 MED ORDER — ACETAMINOPHEN 10 MG/ML IV SOLN: 1000.0000 mg | Freq: Once | INTRAVENOUS | Status: DC | PRN

## 2019-09-04 MED ORDER — LIDOCAINE HCL (PF) 2 % IJ SOLN
INTRAOCULAR | Status: DC | PRN
  Administered 2019-09-04: 2 mL via INTRAOCULAR

## 2019-09-04 MED ORDER — SODIUM HYALURONATE 10 MG/ML IO SOLN
INTRAOCULAR | Status: DC | PRN
  Administered 2019-09-04: 0.55 mL via INTRAOCULAR

## 2019-09-04 MED ORDER — ARMC OPHTHALMIC DILATING DROPS
1.0000 "application " | OPHTHALMIC | Status: DC | PRN
  Administered 2019-09-04 (×3): 1 via OPHTHALMIC

## 2019-09-04 MED ORDER — TETRACAINE HCL 0.5 % OP SOLN
1.0000 [drp] | OPHTHALMIC | Status: DC | PRN
  Administered 2019-09-04 (×3): 1 [drp] via OPHTHALMIC

## 2019-09-04 MED ORDER — ACETAMINOPHEN 325 MG PO TABS
650.0000 mg | ORAL_TABLET | Freq: Once | ORAL | Status: AC
  Administered 2019-09-04: 11:00:00 650 mg via ORAL

## 2019-09-04 MED ORDER — MIDAZOLAM HCL 2 MG/2ML IJ SOLN
INTRAMUSCULAR | Status: DC | PRN
  Administered 2019-09-04 (×2): 1 mg via INTRAVENOUS

## 2019-09-04 MED ORDER — SODIUM HYALURONATE 23 MG/ML IO SOLN
INTRAOCULAR | Status: DC | PRN
  Administered 2019-09-04: 0.6 mL via INTRAOCULAR

## 2019-09-04 MED ORDER — EPINEPHRINE PF 1 MG/ML IJ SOLN
INTRAOCULAR | Status: DC | PRN
  Administered 2019-09-04: 61 mL via OPHTHALMIC

## 2019-09-04 MED ORDER — ONDANSETRON HCL 4 MG/2ML IJ SOLN: 4.0000 mg | Freq: Once | INTRAMUSCULAR | Status: DC | PRN

## 2019-09-04 MED ORDER — FENTANYL CITRATE (PF) 100 MCG/2ML IJ SOLN
INTRAMUSCULAR | Status: DC | PRN
  Administered 2019-09-04 (×2): 50 ug via INTRAVENOUS

## 2019-09-04 SURGICAL SUPPLY — 21 items
CANNULA ANT/CHMB 27G (MISCELLANEOUS) ×2 IMPLANT
CANNULA ANT/CHMB 27GA (MISCELLANEOUS) ×6 IMPLANT
DEVICE INJECT ISTENT W (Stent) IMPLANT
DISSECTOR HYDRO NUCLEUS 50X22 (MISCELLANEOUS) ×3 IMPLANT
GLOVE SURG LX 7.5 STRW (GLOVE) ×2
GLOVE SURG LX STRL 7.5 STRW (GLOVE) ×1 IMPLANT
GLOVE SURG SYN 8.5  E (GLOVE) ×2
GLOVE SURG SYN 8.5 E (GLOVE) ×1 IMPLANT
GLOVE SURG SYN 8.5 PF PI (GLOVE) ×1 IMPLANT
GOWN STRL REUS W/ TWL LRG LVL3 (GOWN DISPOSABLE) ×2 IMPLANT
GOWN STRL REUS W/TWL LRG LVL3 (GOWN DISPOSABLE) ×4
INJECT ISTENT W (Stent) ×3 IMPLANT
LENS IOL TECNIS ITEC 16.0 (Intraocular Lens) ×2 IMPLANT
MARKER SKIN DUAL TIP RULER LAB (MISCELLANEOUS) ×3 IMPLANT
PACK DR. KING ARMS (PACKS) ×3 IMPLANT
PACK EYE AFTER SURG (MISCELLANEOUS) ×3 IMPLANT
PACK OPTHALMIC (MISCELLANEOUS) ×3 IMPLANT
SYR 3ML LL SCALE MARK (SYRINGE) ×3 IMPLANT
SYR TB 1ML LUER SLIP (SYRINGE) ×3 IMPLANT
WATER STERILE IRR 250ML POUR (IV SOLUTION) ×3 IMPLANT
WIPE NON LINTING 3.25X3.25 (MISCELLANEOUS) ×3 IMPLANT

## 2019-09-04 NOTE — H&P (Signed)

## 2019-09-04 NOTE — Op Note (Signed)
OPERATIVE NOTE  Rojean Ige [id number] 09/04/2019  PREOPERATIVE DIAGNOSIS:   1.  Mild PRIMARY open angle glaucoma, right eye. H40.1111  2.  Nuclear sclerotic cataract right eye.  H25.11   POSTOPERATIVE DIAGNOSIS:    same.   PROCEDURE:   1.  Placement of trabecular bypass stent (istent). CPT 0191T  and placement of additional stent  CPT 0376T 2.  Phacoemusification with posterior chamber intraocular lens placement of the right eye  CPT [phone]   LENS: Implant Name Type Inv. Item Serial No. Manufacturer Lot No. LRB No. Used Action  LENS IOL DIOP 16.0 - [id number] Intraocular Lens LENS IOL DIOP 16.0 [id number] AMO  Right 1 Implanted  Marko Stai - [id number] Stent Marko Stai [id number] [employer] [id number] Right 1 Implanted      Procedure(s) with comments: CATARACT EXTRACTION PHACO AND INTRAOCULAR LENS PLACEMENT (IOC) RIGHT ISTENT INJ, 1.67, 00:25.9 (Right) - UPREG  PCB00 +16.0  ULTRASOUND TIME: 0 minutes 25 seconds.  CDE 1.67   SURGEON:  Benay Pillow, MD, MPH  ANESTHESIOLOGIST: Anesthesiologist: Heniser, Fredric Dine, MD CRNA: Vanetta Shawl, CRNA   ANESTHESIA:  MAC and intracameral preservative-free lidocaine 4%.  ESTIMATED BLOOD LOSS: less than 1 mL.   COMPLICATIONS:  None.   DESCRIPTION OF PROCEDURE:  The patient was identified in the holding room and transported to the operating room.   The patient was placed in the supine position under the operating microscope.  The right eye was prepped and draped in the usual sterile ophthalmic fashion.   A 1.0 millimeter clear-corneal paracentesis was made at the 10:30 position. 0.5 ml of preservative-free 1% lidocaine with epinephrine was injected into the anterior chamber.  The anterior chamber was filled with Healon 5 viscoelastic.  A 2.4 millimeter keratome was used to make a near-clear corneal incision at the 8:00 position.   Attention was turned to the istent.  The patients head was turned to the left  and the microscope was tilted to 035 degrees.  Ocular instruments/Glaukos OAL/H2 gonioprism was used with IPC05 (iclip) coupled with Healon 5 on the cornea was used to visualize the trabecular meshwork. The istent was opened and introduced into the eye.  The meshwork was engaged with the tip of the iStent injector and the stent was deployed into Schlemm's canal at 2:00.  The second stent was deployed at 4:00.  The stents were well seated and in good position.  Next, attention was turned to the phacoemulsification A curvilinear capsulorrhexis was made with a cystotome and capsulorrhexis forceps.  Balanced salt solution was used to hydrodissect and hydrodelineate the nucleus.   Phacoemulsification was then used in stop and chop fashion to remove the lens nucleus and epinucleus.  The remaining cortex was then removed using the irrigation and aspiration handpiece. Healon was then placed into the capsular bag to distend it for lens placement.  A lens was then injected into the capsular bag.  The remaining viscoelastic was aspirated.   Wounds were hydrated with balanced salt solution.  The anterior chamber was inflated to a physiologic pressure with balanced salt solution.   Intracameral vigamox 0.1 mL undiluted was injected into the eye and a drop placed onto the ocular surface.  No wound leaks were noted. The patient was taken to the recovery room in stable condition without complications of anesthesia or surgery   Benay Pillow 09/04/2019, 10:31 AM

## 2019-09-04 NOTE — Anesthesia Postprocedure Evaluation (Signed)
Anesthesia Post Note  Patient: Tasha May Risk  Procedure(s) Performed: CATARACT EXTRACTION PHACO AND INTRAOCULAR LENS PLACEMENT (IOC) RIGHT ISTENT INJ, 1.67, 00:25.9 (Right )     Patient location during evaluation: PACU Anesthesia Type: MAC Level of consciousness: awake and alert Pain management: pain level controlled Vital Signs Assessment: post-procedure vital signs reviewed and stable Respiratory status: spontaneous breathing, nonlabored ventilation, respiratory function stable and patient connected to nasal cannula oxygen Cardiovascular status: stable and blood pressure returned to baseline Postop Assessment: no apparent nausea or vomiting Anesthetic complications: no    Kord Monette A  Kathyann Spaugh

## 2019-09-04 NOTE — Transfer of Care (Signed)
Immediate Anesthesia Transfer of Care Note  Patient: Tasha May  Procedure(s) Performed: CATARACT EXTRACTION PHACO AND INTRAOCULAR LENS PLACEMENT (IOC) RIGHT ISTENT INJ, 1.67, 00:25.9 (Right )  Patient Location: PACU  Anesthesia Type: MAC  Level of Consciousness: awake, alert  and patient cooperative  Airway and Oxygen Therapy: Patient Spontanous Breathing and Patient connected to supplemental oxygen  Post-op Assessment: Post-op Vital signs reviewed, Patient's Cardiovascular Status Stable, Respiratory Function Stable, Patent Airway and No signs of Nausea or vomiting  Post-op Vital Signs: Reviewed and stable  Complications: No apparent anesthesia complications

## 2019-09-05 ENCOUNTER — Encounter: Payer: Self-pay | Admitting: *Deleted

## 2019-09-12 ENCOUNTER — Encounter: Payer: Self-pay | Admitting: Ophthalmology

## 2019-09-12 ENCOUNTER — Other Ambulatory Visit: Payer: Self-pay

## 2019-09-12 NOTE — Discharge Instructions (Signed)

## 2019-09-14 ENCOUNTER — Other Ambulatory Visit
Admission: RE | Admit: 2019-09-14 | Discharge: 2019-09-14 | Disposition: A | Discharge status: Home / Self Care | Payer: BC Managed Care – PPO | Source: Ambulatory Visit | Attending: Ophthalmology | Admitting: Ophthalmology

## 2019-09-14 ENCOUNTER — Other Ambulatory Visit: Payer: Self-pay

## 2019-09-14 DIAGNOSIS — Z20828 Contact with and (suspected) exposure to other viral communicable diseases: Secondary | ICD-10-CM | POA: Insufficient documentation

## 2019-09-14 DIAGNOSIS — Z01812 Encounter for preprocedural laboratory examination: Secondary | ICD-10-CM | POA: Diagnosis present

## 2019-09-14 LAB — SARS CORONAVIRUS 2 (TAT 6-24 HRS): SARS Coronavirus 2: NEGATIVE

## 2019-09-18 ENCOUNTER — Ambulatory Visit: Payer: BC Managed Care – PPO | Admitting: Anesthesiology

## 2019-09-18 ENCOUNTER — Ambulatory Visit
Admission: RE | Admit: 2019-09-18 | Discharge: 2019-09-18 | Disposition: A | Discharge status: Home / Self Care | Payer: BC Managed Care – PPO | Attending: Ophthalmology | Admitting: Ophthalmology

## 2019-09-18 ENCOUNTER — Encounter
Admission: RE | Disposition: A | Discharge status: Home / Self Care | Payer: Self-pay | Source: Home / Self Care | Attending: Ophthalmology

## 2019-09-18 ENCOUNTER — Encounter: Payer: Self-pay | Admitting: Ophthalmology

## 2019-09-18 ENCOUNTER — Other Ambulatory Visit: Payer: Self-pay

## 2019-09-18 DIAGNOSIS — H2512 Age-related nuclear cataract, left eye: Secondary | ICD-10-CM | POA: Insufficient documentation

## 2019-09-18 DIAGNOSIS — Z886 Allergy status to analgesic agent status: Secondary | ICD-10-CM | POA: Insufficient documentation

## 2019-09-18 DIAGNOSIS — Z79899 Other long term (current) drug therapy: Secondary | ICD-10-CM | POA: Insufficient documentation

## 2019-09-18 DIAGNOSIS — Z9884 Bariatric surgery status: Secondary | ICD-10-CM | POA: Insufficient documentation

## 2019-09-18 DIAGNOSIS — H401121 Primary open-angle glaucoma, left eye, mild stage: Secondary | ICD-10-CM | POA: Insufficient documentation

## 2019-09-18 HISTORY — PX: CATARACT EXTRACTION W/PHACO: SHX586

## 2019-09-18 LAB — POCT PREGNANCY, URINE: Preg Test, Ur: NEGATIVE

## 2019-09-18 SURGERY — PHACOEMULSIFICATION, CATARACT, WITH IOL INSERTION
Anesthesia: Monitor Anesthesia Care | Laterality: Left

## 2019-09-18 MED ORDER — ARMC OPHTHALMIC DILATING DROPS
1.0000 "application " | OPHTHALMIC | Status: DC | PRN
  Administered 2019-09-18 (×3): 1 via OPHTHALMIC

## 2019-09-18 MED ORDER — ACETAMINOPHEN 325 MG PO TABS
325.0000 mg | ORAL_TABLET | ORAL | Status: DC | PRN
  Administered 2019-09-18: 10:00:00 650 mg via ORAL

## 2019-09-18 MED ORDER — MOXIFLOXACIN HCL 0.5 % OP SOLN
OPHTHALMIC | Status: DC | PRN
  Administered 2019-09-18: 0.2 mL via OPHTHALMIC

## 2019-09-18 MED ORDER — EPINEPHRINE PF 1 MG/ML IJ SOLN
INTRAOCULAR | Status: DC | PRN
  Administered 2019-09-18: 10:00:00 44 mL via OPHTHALMIC

## 2019-09-18 MED ORDER — SODIUM HYALURONATE 10 MG/ML IO SOLN
INTRAOCULAR | Status: DC | PRN
  Administered 2019-09-18: 0.55 mL via INTRAOCULAR

## 2019-09-18 MED ORDER — TETRACAINE HCL 0.5 % OP SOLN
1.0000 [drp] | OPHTHALMIC | Status: DC | PRN
  Administered 2019-09-18 (×3): 1 [drp] via OPHTHALMIC

## 2019-09-18 MED ORDER — ACETAMINOPHEN 160 MG/5ML PO SOLN: 325.0000 mg | ORAL | Status: DC | PRN

## 2019-09-18 MED ORDER — FENTANYL CITRATE (PF) 100 MCG/2ML IJ SOLN
INTRAMUSCULAR | Status: DC | PRN
  Administered 2019-09-18: 100 ug via INTRAVENOUS

## 2019-09-18 MED ORDER — MIDAZOLAM HCL 2 MG/2ML IJ SOLN
INTRAMUSCULAR | Status: DC | PRN
  Administered 2019-09-18: 2 mg via INTRAVENOUS
  Administered 2019-09-18: 1 mg via INTRAVENOUS

## 2019-09-18 MED ORDER — LIDOCAINE HCL (PF) 2 % IJ SOLN
INTRAOCULAR | Status: DC | PRN
  Administered 2019-09-18: 2 mL via INTRAOCULAR

## 2019-09-18 MED ORDER — SODIUM HYALURONATE 23 MG/ML IO SOLN
INTRAOCULAR | Status: DC | PRN
  Administered 2019-09-18: 0.6 mL via INTRAOCULAR

## 2019-09-18 SURGICAL SUPPLY — 22 items
CANNULA ANT/CHMB 27G (MISCELLANEOUS) ×2 IMPLANT
CANNULA ANT/CHMB 27GA (MISCELLANEOUS) ×6 IMPLANT
DEVICE INJECT ISTENT W (Stent) IMPLANT
DISSECTOR HYDRO NUCLEUS 50X22 (MISCELLANEOUS) ×3 IMPLANT
GLOVE SURG LX 7.5 STRW (GLOVE) ×2
GLOVE SURG LX STRL 7.5 STRW (GLOVE) ×1 IMPLANT
GLOVE SURG SYN 8.5  E (GLOVE) ×2
GLOVE SURG SYN 8.5 E (GLOVE) ×1 IMPLANT
GLOVE SURG SYN 8.5 PF PI (GLOVE) ×1 IMPLANT
GOWN STRL REUS W/ TWL LRG LVL3 (GOWN DISPOSABLE) ×2 IMPLANT
GOWN STRL REUS W/TWL LRG LVL3 (GOWN DISPOSABLE) ×4
ICLIP (OPHTHALMIC RELATED) ×2 IMPLANT
INJECT ISTENT W (Stent) ×3 IMPLANT
LENS IOL TECNIS ITEC 18.0 (Intraocular Lens) ×2 IMPLANT
MARKER SKIN DUAL TIP RULER LAB (MISCELLANEOUS) ×3 IMPLANT
PACK DR. KING ARMS (PACKS) ×3 IMPLANT
PACK EYE AFTER SURG (MISCELLANEOUS) ×3 IMPLANT
PACK OPTHALMIC (MISCELLANEOUS) ×3 IMPLANT
SYR 3ML LL SCALE MARK (SYRINGE) ×3 IMPLANT
SYR TB 1ML LUER SLIP (SYRINGE) ×3 IMPLANT
WATER STERILE IRR 250ML POUR (IV SOLUTION) ×3 IMPLANT
WIPE NON LINTING 3.25X3.25 (MISCELLANEOUS) ×3 IMPLANT

## 2019-09-18 NOTE — Anesthesia Procedure Notes (Signed)
Procedure Name: MAC Performed by: Iden Stripling, CRNA Pre-anesthesia Checklist: Patient identified, Emergency Drugs available, Suction available, Timeout performed and Patient being monitored Patient Re-evaluated:Patient Re-evaluated prior to induction Oxygen Delivery Method: Nasal cannula Placement Confirmation: positive ETCO2       

## 2019-09-18 NOTE — Transfer of Care (Signed)
Immediate Anesthesia Transfer of Care Note  Patient: Tasha May  Procedure(s) Performed: CATARACT EXTRACTION PHACO AND INTRAOCULAR LENS PLACEMENT (IOC) LEFT ISTENT INJ 1.44, 00:20.8 (Left )  Patient Location: PACU  Anesthesia Type: MAC  Level of Consciousness: awake, alert  and patient cooperative  Airway and Oxygen Therapy: Patient Spontanous Breathing and Patient connected to supplemental oxygen  Post-op Assessment: Post-op Vital signs reviewed, Patient's Cardiovascular Status Stable, Respiratory Function Stable, Patent Airway and No signs of Nausea or vomiting  Post-op Vital Signs: Reviewed and stable  Complications: No apparent anesthesia complications

## 2019-09-18 NOTE — Op Note (Signed)
OPERATIVE NOTE  Tasha May [id number] 09/18/2019  PREOPERATIVE DIAGNOSIS:   1.  Mild  PRIMARY open angle glaucoma, left eye. J88.3254 2.  Nuclear sclerotic cataract left eye.  H25.12   POSTOPERATIVE DIAGNOSIS:    same.   PROCEDURE:   1.  Placement of trabecular bypass stent (istent). CPT 0191T  and placement of additional stent  CPT 0376T 2.  Phacoemusification with posterior chamber intraocular lens placement of the right eye  CPT [phone]   LENS: Implant Name Type Inv. Item Serial No. Manufacturer Lot No. LRB No. Used Action  Marko Stai - [id number] Stent Marko Stai [id number] [employer]  Left 1 Implanted  LENS IOL DIOP 18.0 - [id number] Intraocular Lens LENS IOL DIOP 18.0 [id number] AMO  Left 1 Implanted      Procedure(s): CATARACT EXTRACTION PHACO AND INTRAOCULAR LENS PLACEMENT (IOC) LEFT ISTENT INJ 1.44, 00:20.8 (Left)  PCB00 +18.0   ULTRASOUND TIME: 0 minutes 20 seconds.  CDE 1.44   SURGEON:  Benay Pillow, MD, MPH  ANESTHESIOLOGIST: Anesthesiologist: Rochel Brome, MD CRNA: Mayme Genta, CRNA   ANESTHESIA:  MAC and intracameral preservative-free intracameral lidocaine 4%.  ESTIMATED BLOOD LOSS: less than 1 mL.   COMPLICATIONS:  None.   DESCRIPTION OF PROCEDURE:  The patient was identified in the holding room and transported to the operating room.   The patient was placed in the supine position under the operating microscope.    A 1.0 millimeter clear-corneal paracentesis was made at the 4:30 position. 0.5 ml of preservative-free 1% lidocaine with epinephrine was injected into the anterior chamber.  The anterior chamber was filled with Healon 5 viscoelastic.  A 2.4 millimeter keratome was used to make a near-clear corneal incision at the 2:00 position.   Attention was turned to the istent.  The patients head was turned to the left and the microscope was tilted to 035 degrees.  Ocular instruments/Glau[employer]os OAL/H2 gonioprism was used  with IPC05 (iclip) coupled with Healon 5 on the cornea was used to visualize the trabecular meshwork. The istent was opened and introduced into the eye.  The meshwork was engaged with the tip of the iStent injector and the stent was deployed into Schlemm's canal at 10:30.  The second stent was deployed at 8:00.  The stents were well seated and in good position.  Next, attention was turned to the phacoemulsification A curvilinear capsulorrhexis was made with a cystotome and capsulorrhexis forceps.  Balanced salt solution was used to hydrodissect and hydrodelineate the nucleus.   Phacoemulsification was then used in stop and chop fashion to remove the lens nucleus and epinucleus.  The remaining cortex was then removed using the irrigation and aspiration handpiece. Healon was then placed into the capsular bag to distend it for lens placement.  A lens was then injected into the capsular bag.  The remaining viscoelastic was aspirated.   Wounds were hydrated with balanced salt solution.  The anterior chamber was inflated to a physiologic pressure with balanced salt solution.   Intracameral vigamox 0.1 mL undiluted was injected into the eye and a drop placed onto the ocular surface.  No wound leaks were noted. The patient was taken to the recovery room in stable condition without complications of anesthesia or surgery   Benay Pillow 09/18/2019, 10:04 AM

## 2019-09-18 NOTE — Anesthesia Preprocedure Evaluation (Signed)
Anesthesia Evaluation  Patient identified by MRN, date of birth, ID band Patient awake    Reviewed: Allergy & Precautions, H&P , NPO status , Patient's Chart, lab work & pertinent test results, reviewed documented beta blocker date and time   Airway Mallampati: II  TM Distance: >3 FB Neck ROM: full    Dental no notable dental hx.    Pulmonary neg pulmonary ROS,  No longer needs CPAP after weight loss/bariatric surgery   Pulmonary exam normal breath sounds clear to auscultation       Cardiovascular Exercise Tolerance: Good negative cardio ROS Normal cardiovascular exam Rhythm:regular Rate:Normal     Neuro/Psych negative neurological ROS  negative psych ROS   GI/Hepatic Neg liver ROS, GERD  Controlled,  Endo/Other  negative endocrine ROS  Renal/GU negative Renal ROS  negative genitourinary   Musculoskeletal   Abdominal   Peds  Hematology negative hematology ROS (+)   Anesthesia Other Findings   Reproductive/Obstetrics negative OB ROS                             Anesthesia Physical Anesthesia Plan  ASA: II  Anesthesia Plan: MAC   Post-op Pain Management:    Induction:   PONV Risk Score and Plan: 2  Airway Management Planned:   Additional Equipment:   Intra-op Plan:   Post-operative Plan:   Informed Consent: I have reviewed the patients History and Physical, chart, labs and discussed the procedure including the risks, benefits and alternatives for the proposed anesthesia with the patient or authorized representative who has indicated his/her understanding and acceptance.     Dental Advisory Given  Plan Discussed with: CRNA  Anesthesia Plan Comments:         Anesthesia Quick Evaluation

## 2019-09-18 NOTE — Anesthesia Postprocedure Evaluation (Signed)
Anesthesia Post Note  Patient: Tasha May  Procedure(s) Performed: CATARACT EXTRACTION PHACO AND INTRAOCULAR LENS PLACEMENT (IOC) LEFT ISTENT INJ 1.44, 00:20.8 (Left )     Patient location during evaluation: PACU Anesthesia Type: MAC Level of consciousness: awake and alert Pain management: pain level controlled Vital Signs Assessment: post-procedure vital signs reviewed and stable Respiratory status: spontaneous breathing, nonlabored ventilation, respiratory function stable and patient connected to nasal cannula oxygen Cardiovascular status: stable and blood pressure returned to baseline Postop Assessment: no apparent nausea or vomiting Anesthetic complications: no    Trecia Rogers

## 2019-09-18 NOTE — H&P (Signed)

## 2019-09-19 ENCOUNTER — Encounter: Payer: Self-pay | Admitting: *Deleted

## 2019-11-19 ENCOUNTER — Ambulatory Visit: Payer: BC Managed Care – PPO | Attending: Internal Medicine

## 2019-11-19 DIAGNOSIS — Z23 Encounter for immunization: Secondary | ICD-10-CM

## 2019-11-19 NOTE — Progress Notes (Signed)
   Covid-19 Vaccination Clinic  Name:  Tasha May    MRN: [id number] DOB: 01-12-1966  11/19/2019  [name] was observed post Covid-19 immunization for 15 minutes without incidence. She was provided with Vaccine Information Sheet and instruction to access the V-Safe system.   [name] was instructed to call 911 with any severe reactions post vaccine: Marland Kitchen Difficulty breathing  . Swelling of your face and throat  . A fast heartbeat  . A bad rash all over your body  . Dizziness and weakness    Immunizations Administered    Name Date Dose VIS Date Route   Pfizer COVID-19 Vaccine 11/19/2019 12:29 PM 0.3 mL 09/01/2019 Intramuscular   Manufacturer: [employer], [employer]   Lot: [id number]   NDC: [address]-4

## 2019-12-12 ENCOUNTER — Ambulatory Visit: Payer: BC Managed Care – PPO | Attending: Internal Medicine

## 2019-12-12 DIAGNOSIS — Z23 Encounter for immunization: Secondary | ICD-10-CM

## 2019-12-12 NOTE — Progress Notes (Signed)
   Covid-19 Vaccination Clinic  Name:  Latavia Goga    MRN: [id number] DOB: Sep 23, 1965  12/12/2019  Ms. Berwanger was observed post Covid-19 immunization for 15 minutes without incident. She was provided with Vaccine Information Sheet and instruction to access the V-Safe system.   Ms. Boyde was instructed to call 911 with any severe reactions post vaccine: Marland Kitchen Difficulty breathing  . Swelling of face and throat  . A fast heartbeat  . A bad rash all over body  . Dizziness and weakness   Immunizations Administered    Name Date Dose VIS Date Route   Pfizer COVID-19 Vaccine 12/12/2019  2:35 PM 0.3 mL 09/01/2019 Intramuscular   Manufacturer: [employer]   Lot: [id number]   NDC: [address]-2

## 2022-06-04 ENCOUNTER — Other Ambulatory Visit: Payer: Self-pay | Admitting: Certified Nurse Midwife

## 2022-06-04 DIAGNOSIS — Z1231 Encounter for screening mammogram for malignant neoplasm of breast: Secondary | ICD-10-CM

## 2023-09-20 ENCOUNTER — Encounter: Payer: Self-pay | Admitting: Oncology

## 2023-10-12 ENCOUNTER — Other Ambulatory Visit: Payer: Self-pay | Admitting: Obstetrics and Gynecology

## 2023-10-12 DIAGNOSIS — Z1231 Encounter for screening mammogram for malignant neoplasm of breast: Secondary | ICD-10-CM

## 2024-04-18 ENCOUNTER — Other Ambulatory Visit: Payer: Self-pay | Admitting: Medical Genetics

## 2024-05-09 ENCOUNTER — Ambulatory Visit
Admission: RE | Admit: 2024-05-09 | Discharge: 2024-05-09 | Disposition: A | Discharge status: Home / Self Care | Payer: Self-pay | Source: Ambulatory Visit | Attending: Obstetrics and Gynecology | Admitting: Obstetrics and Gynecology

## 2024-05-09 ENCOUNTER — Encounter: Payer: Self-pay | Admitting: Oncology

## 2024-05-09 DIAGNOSIS — Z1231 Encounter for screening mammogram for malignant neoplasm of breast: Secondary | ICD-10-CM | POA: Insufficient documentation

## 2024-07-18 ENCOUNTER — Other Ambulatory Visit: Payer: Self-pay | Admitting: Medical Genetics

## 2024-07-18 DIAGNOSIS — Z006 Encounter for examination for normal comparison and control in clinical research program: Secondary | ICD-10-CM
